# Patient Record
Sex: Female | Born: 1980 | Race: White | Hispanic: Yes | Marital: Single | Smoking: Never smoker
Health system: Southern US, Community
[De-identification: ages and names within clinical notes are randomized; demographics above are authoritative.]

## PROBLEM LIST (undated history)

## (undated) DIAGNOSIS — E119 Type 2 diabetes mellitus without complications: Secondary | ICD-10-CM

## (undated) DIAGNOSIS — E079 Disorder of thyroid, unspecified: Secondary | ICD-10-CM

## (undated) DIAGNOSIS — N912 Amenorrhea, unspecified: Secondary | ICD-10-CM

## (undated) HISTORY — DX: Type 2 diabetes mellitus without complications: E11.9

## (undated) HISTORY — DX: Amenorrhea, unspecified: N91.2

## (undated) HISTORY — DX: Disorder of thyroid, unspecified: E07.9

## (undated) HISTORY — PX: WISDOM TOOTH EXTRACTION: SHX21

---

## 2004-09-27 ENCOUNTER — Ambulatory Visit: Payer: Self-pay | Admitting: Obstetrics & Gynecology

## 2005-12-12 ENCOUNTER — Encounter: Admission: RE | Admit: 2005-12-12 | Discharge: 2006-03-12 | Payer: Self-pay | Admitting: Family Medicine

## 2005-12-21 ENCOUNTER — Ambulatory Visit: Payer: Self-pay | Admitting: Obstetrics and Gynecology

## 2008-02-27 ENCOUNTER — Emergency Department (HOSPITAL_COMMUNITY): Admission: EM | Admit: 2008-02-27 | Discharge: 2008-02-28 | Payer: Self-pay | Admitting: Emergency Medicine

## 2008-03-13 ENCOUNTER — Other Ambulatory Visit: Admission: RE | Admit: 2008-03-13 | Discharge: 2008-03-13 | Payer: Self-pay | Admitting: Family Medicine

## 2008-03-13 ENCOUNTER — Ambulatory Visit: Payer: Self-pay | Admitting: Family Medicine

## 2008-03-13 ENCOUNTER — Encounter: Payer: Self-pay | Admitting: Family

## 2008-04-03 ENCOUNTER — Ambulatory Visit: Payer: Self-pay | Admitting: Family Medicine

## 2009-08-04 ENCOUNTER — Ambulatory Visit (HOSPITAL_COMMUNITY): Admission: RE | Admit: 2009-08-04 | Discharge: 2009-08-04 | Payer: Self-pay | Admitting: Obstetrics & Gynecology

## 2009-08-25 ENCOUNTER — Ambulatory Visit (HOSPITAL_COMMUNITY)
Admission: RE | Admit: 2009-08-25 | Discharge: 2009-08-25 | Payer: Self-pay | Source: Home / Self Care | Admitting: Family Medicine

## 2009-12-10 ENCOUNTER — Ambulatory Visit (HOSPITAL_COMMUNITY)
Admission: RE | Admit: 2009-12-10 | Discharge: 2009-12-10 | Payer: Self-pay | Source: Home / Self Care | Admitting: Family Medicine

## 2010-01-09 ENCOUNTER — Inpatient Hospital Stay (HOSPITAL_COMMUNITY)
Admission: AD | Admit: 2010-01-09 | Discharge: 2010-01-11 | Payer: Self-pay | Source: Home / Self Care | Admitting: Obstetrics & Gynecology

## 2010-04-26 LAB — CBC
HCT: 36.9 % (ref 36.0–46.0)
MCH: 29.4 pg (ref 26.0–34.0)
MCV: 86 fL (ref 78.0–100.0)
RBC: 4.29 MIL/uL (ref 3.87–5.11)
WBC: 8.3 10*3/uL (ref 4.0–10.5)

## 2010-05-30 LAB — URINALYSIS, ROUTINE W REFLEX MICROSCOPIC
Bilirubin Urine: NEGATIVE
Glucose, UA: NEGATIVE mg/dL
Hgb urine dipstick: NEGATIVE
Specific Gravity, Urine: 1.026 (ref 1.005–1.030)

## 2010-05-30 LAB — WET PREP, GENITAL
Clue Cells Wet Prep HPF POC: NONE SEEN
Trich, Wet Prep: NONE SEEN
Yeast Wet Prep HPF POC: NONE SEEN

## 2010-05-30 LAB — POCT PREGNANCY, URINE: Preg Test, Ur: NEGATIVE

## 2010-05-30 LAB — GC/CHLAMYDIA PROBE AMP, GENITAL: GC Probe Amp, Genital: NEGATIVE

## 2010-06-28 NOTE — Group Therapy Note (Signed)
Kristin Wall, Kristin Wall               ACCOUNT NO.:  [id number]   MEDICAL RECORD NO.:  [id number]          PATIENT TYPE:  WOC   LOCATION:  [facility]                   FACILITY:  [facility]   PHYSICIAN:  Sid Falcon, CNM  DATE OF BIRTH:  September 09, 1980   DATE OF SERVICE:                                  CLINIC NOTE   CHIEF COMPLAINT:  The patient is here for evaluation of a right labial  mole.  In addition, the patient is here for contraceptive start due to  irregular menses.   MEDICAL HISTORY:  1. PCOS.  2. Fertility.   The patient denies any new medical diagnosis in the past year.  [facility]  said it was a negative exam and a Pap smear was within normal limits.  The patient is reporting that she is not desiring pregnancy at this time  and that she would rather get her cycles regulated and desires to begin  oral contraceptive.  In regards to labial mole, the patient said she has  had it for several years and it has not changed in appearance and/or  size.  The patient did not get the labs done for PCOS due to finances  and declines them at this time.   PHYSICAL EXAMINATION:  The patient is alert and oriented x3 and in no  signs of acute distress.  Vagina:  There was approximately a 0.01-cm  circular, black, mole-like lesion on the right labial regular borders.  The site was numbed with 2% lidocaine, anesthesia obtained.  A pump  biopsy was done without difficulty.   ASSESSMENT:  1. Irregular menses.  2. Vaginal lesions.   PLAN:  Pump biopsy to the lab.  Prescription for Lo-Ovral FE, the  patient is to take 1 pill and is to abstain from sex for 2 weeks.  Take  a pregnancy test, if negative, to begin pills that day with backup  method use for 2 weeks.  The patient will follow up in 3 weeks for  results of the biopsy.      Sid Falcon, CNM     WM/MEDQ  D:  03/13/2008  T:  03/13/2008  Job:  [id number]

## 2010-07-01 NOTE — Group Therapy Note (Signed)
[name], [name]               ACCOUNT NO.:  [id number]   MEDICAL RECORD NO.:  [id number]          PATIENT TYPE:  WOC   LOCATION[facility]                   FACILITY:  [facility]   PHYSICIAN:  Deirdre Christy Gentles, CNM       DATE OF BIRTH:  [birth year]   DATE OF SERVICE:                                    CLINIC NOTE   REASON FOR VISIT:  Evaluate nipple lesion and possible PCOS.   HISTORY:  This is a 30 year old nulliparous Hispanic female who was most  recently seen November 22, 2005, at [facility] due to a nipple lesion and  she is also still unable to become pregnant and would like to get the  evaluation for PCOS.  The nipple lesion first erupted about two months ago  and she was seen in our clinic, given three days of antibiotics, it is  slightly larger in size but, otherwise, has been unchanged over about two  months.  It is not painful and not draining.  She has no nipple discharge.   The possible PCOS was initially evaluated by Dr. Penne Lash in August 2006.  She was to have lab work including TSH fasting, two hour GTT fasting, 17-  hydroxy progesterone, and husband was to get a semen analysis, however, none  of this was done due to not having finances.  She continues to have  intercourse several times a week, has never done basal body temperature  testing, and her menstrual history is essentially the same as she told us a  year ago, namely that her cycles are not exactly regular, but she does not  go long intervals between bleeds.  Of note, she has had some inter-menstrual  spotting.  Most recently, she spotted on October 10 and had a normal menses  that began November 28, 2005.  The previous menstrual period was sometime in  September 2007, she is unsure because she is not keeping a menstrual  calendar.  She denies any recent hirsutism.  She denies breast tenderness or  dyspareunia.   PAST GYN HISTORY:  Negative for cysts, tumors, STIs.  A Pap smear was done  on November 22, 2005,  at [facility] and was negative.   PAST MEDICAL AND SURGICAL HISTORY:  Noncontributory.   PHYSICAL EXAMINATION:  Obese female, NAD, some mild hirsutism and acne are  obvious.  Breast exam:  There is about a 3 cm red, scaly area, slightly  excoriated medial to left nipple, mostly on areola, this is not tender with  no purulent drainage.  There is no lymphadenopathy.  No discrete breast  mass.   ASSESSMENT:  Breast lesion, not healing spontaneously, unclear etiology,  possible PCOS, primary amenorrhea, obesity.   PLAN:  I consulted with Dr. Okey Dupre who examined the lesion and recommends  referral to a dermatologist for a biopsy.  She has been given the  prescription for a semen analysis, TSH fasting, two hour GTT fasting, 17-  hydroxy progesterone, which she is interested in obtaining if she can get  financial assistance.  She will apply for financial assistance today.  She  is also urged  to keep a menstrual calendar.  She is given another  prescription for a basal body temperature chart.  Her results from [facility] visit of November 28, 2005, showed negative Pap smear,  negative GC, negative Chlamydia, glucose 84, urine pregnancy test was  negative.  No labs are done today.  At the time of dictation, we are still  waiting to see who her dermatology referral will be with.           ______________________________  Caren Griffins, CNM     DP/MEDQ  D:  12/21/2005  T:  12/21/2005  Job:  [id number]

## 2010-07-01 NOTE — Group Therapy Note (Signed)
Kristin Wall, Kristin Wall               ACCOUNT NO.:  [id number]   MEDICAL RECORD NO.:  [id number]          PATIENT TYPE:  WOC   [facility]ATION[facility]                   [facility]ACILITY[facility]  [facility]   PHYSICIAN:  Elsie Lincoln, MD      DATE OF BIRTH:  1980/03/07   DATE OF SERVICE:  09/27/2004                                    CLINIC NOTE   REASON FOR VISIT:  The patient is a 30 year old G0 female who was sent here  from [facility] for workup of irregular menses. However, in speaking  with the patient, the patient does have a period every month. However,  sometimes it can be 3-7 days later than what she expected, so anywhere from  28-33 days. I explained to the patient that this can be normal.   The patient also has sex four times a week for the past year and has not  been able to be pregnant. She has never been pregnant before and her  boyfriend has never fathered any children to her knowledge. The patient does  not have molimina, no breast tenderness, no bloating, no moodiness with her  periods. It is questionable that this could be an anovulatory bleed.  Visually, the patient looks like she has hirsutism and acne and is  overweight. She does have a family history significant for diabetes. The  patient could be PCO and not ovulating, but just having some spotting.   PAST OBSTETRICAL HISTORY:  G0.   PAST GYNECOLOGICAL HISTORY:  No history of ovarian cysts, fibroid tumors,  sexually-transmitted diseases, or abnormal Pap smears.   PAST MEDICAL HISTORY:  Denies.   PAST SURGICAL HISTORY:  Denies.   PHYSICAL EXAMINATION:  GENERAL:  Well-nourished, well-developed, no apparent  distress. Positive acanthosis nigricans on the neck, hirsutism, and acne.  VITAL SIGNS:  Temperature 98.4, pulse 87, blood pressure 147/87, weight  208.4, height 5 feet 2.5 inches.   The patient questionable has polycystic ovarian syndrome. Will do TSH, a  fasting 2-hour GTT, and fasting 17-hydroxyprogesterone. Will also  get a  semen analysis. The patient is to keep a menstrual calendar and come back in  4 weeks for results.           ______________________________  Elsie Lincoln, MD     KL/MEDQ  D:  09/27/2004  T:  09/28/2004  Job:  [id number]

## 2011-10-17 IMAGING — US US OB COMP +14 WK
2 series · 14 of 28 positions shown · non-contrast
Comparison: none

OBSTETRICAL ULTRASOUND:
 This ultrasound exam was performed in the [HOSPITAL] Ultrasound Department.  The OB US report was generated in the AS system, and faxed to the ordering physician.  This report is also available in [HOSPITAL]?s AccessANYware and in [REDACTED] PACS.

[Series 1: us ob detail +14 wk · 0.22mm/px · 11 of 50 slices shown (1 of 2)]
[im 3/50]
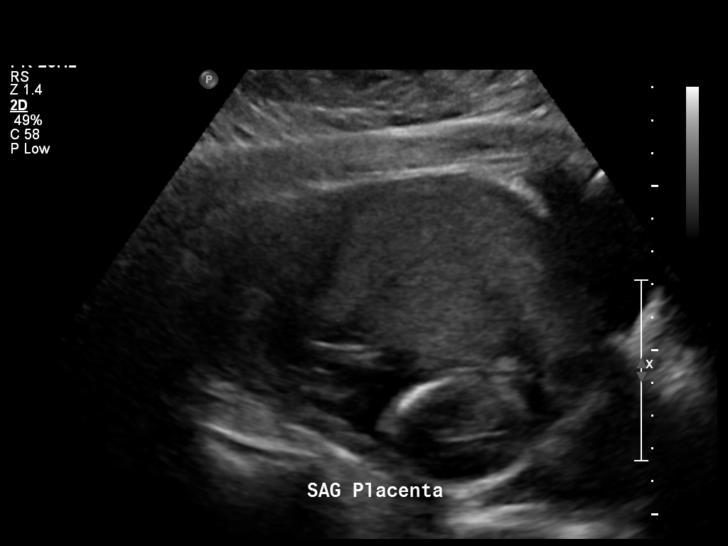
[im 7/50]
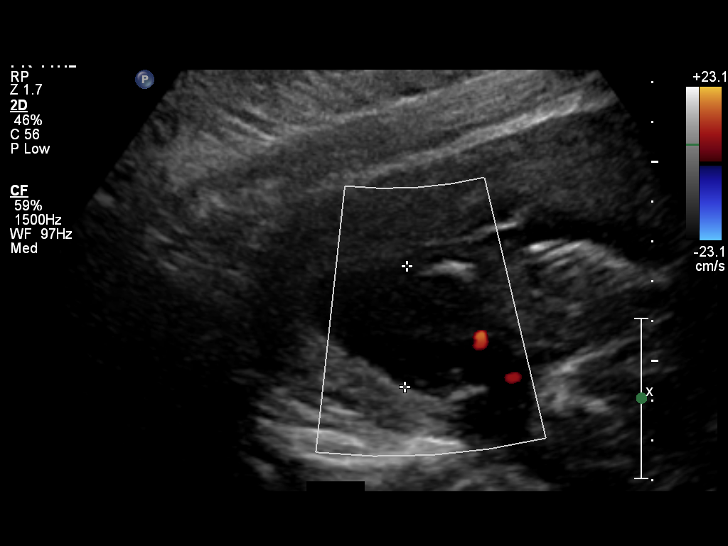
[im 12/50]
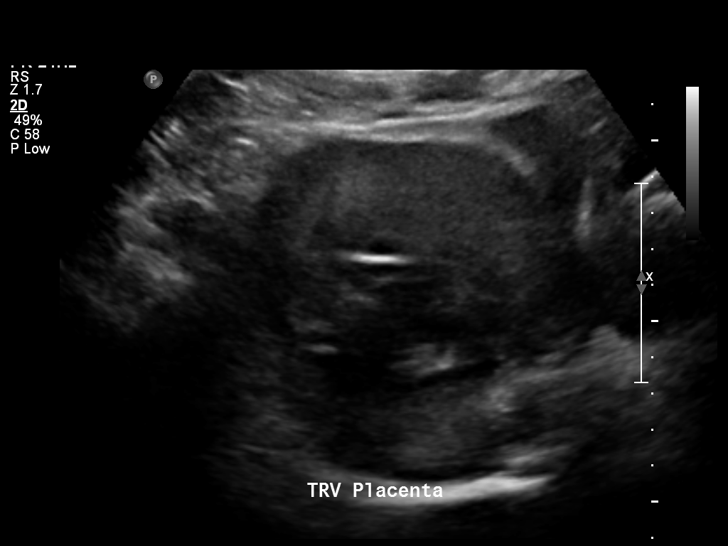
[im 16/50]
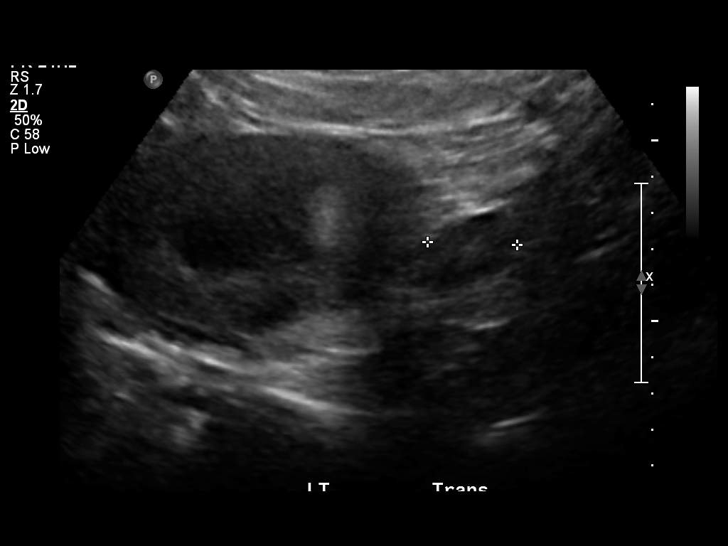
[im 21/50]
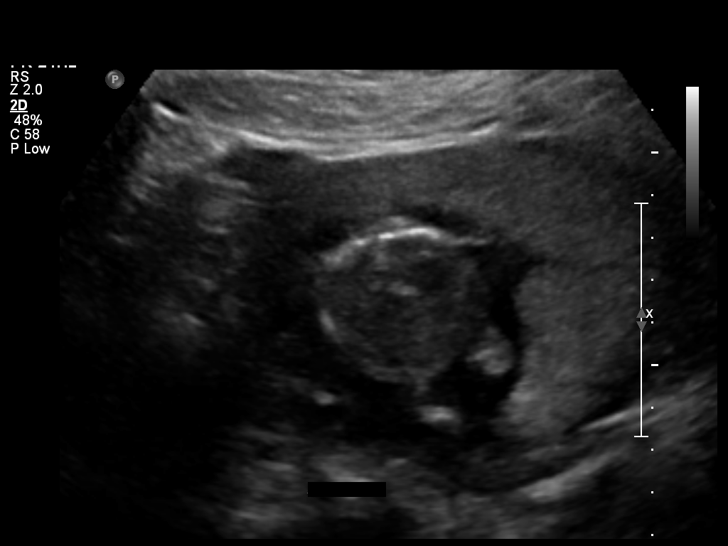
[im 25/50]
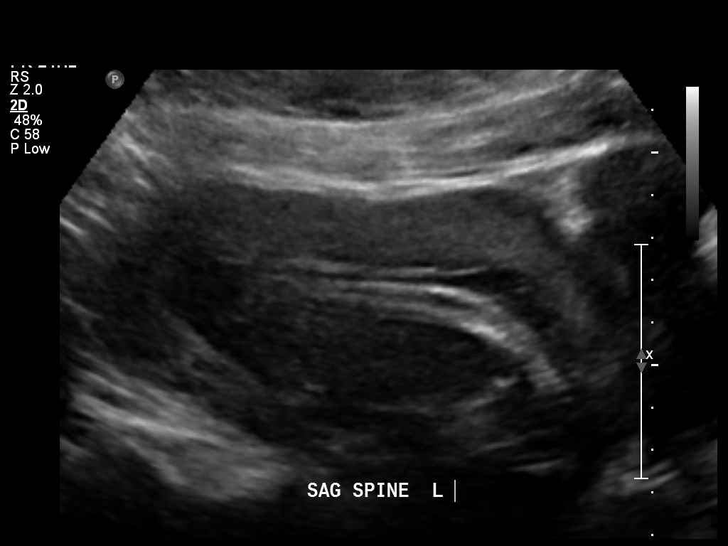
[im 29/50]
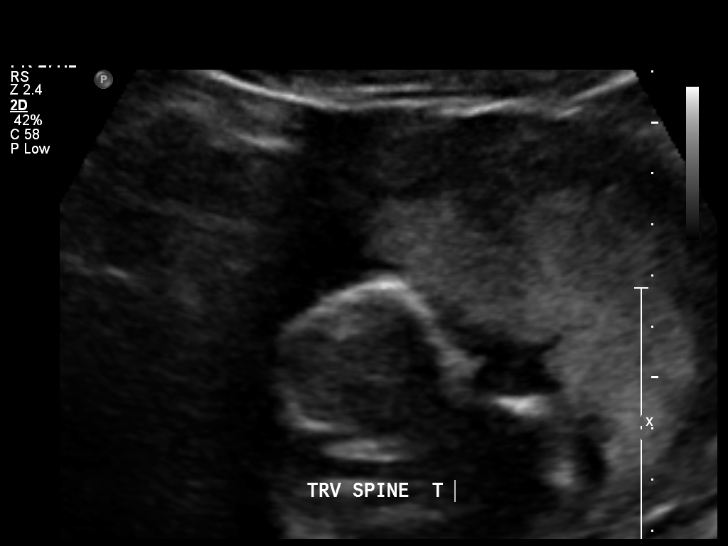
[im 34/50]
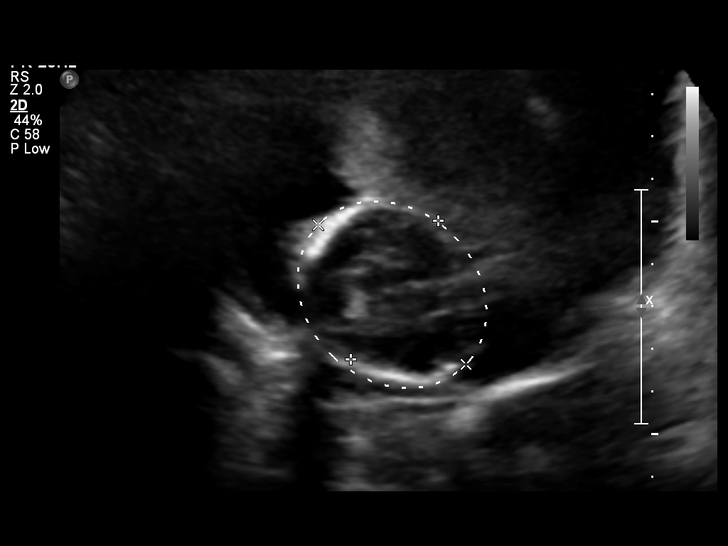
[im 38/50]
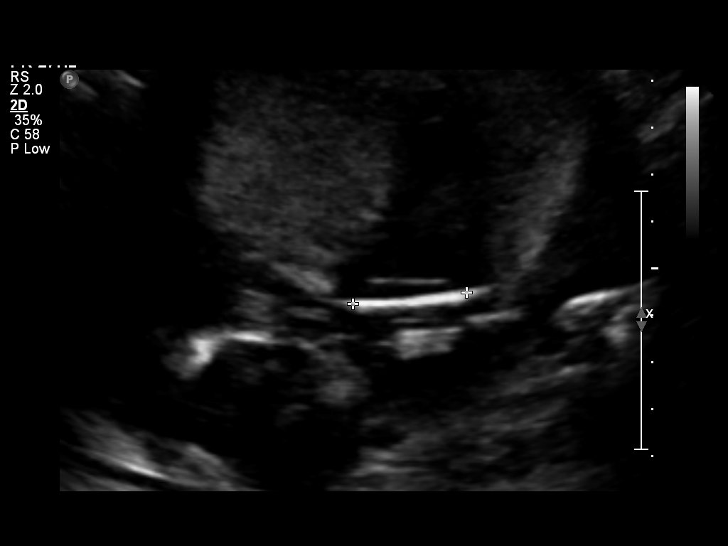
[im 43/50]
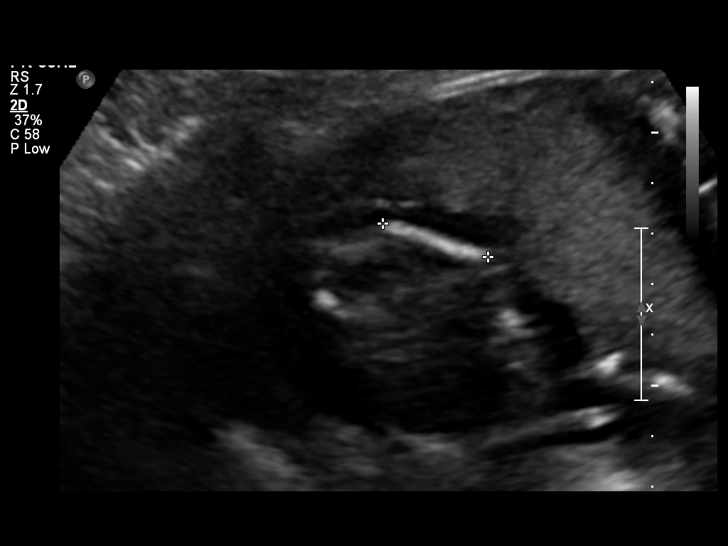
[im 47/50]
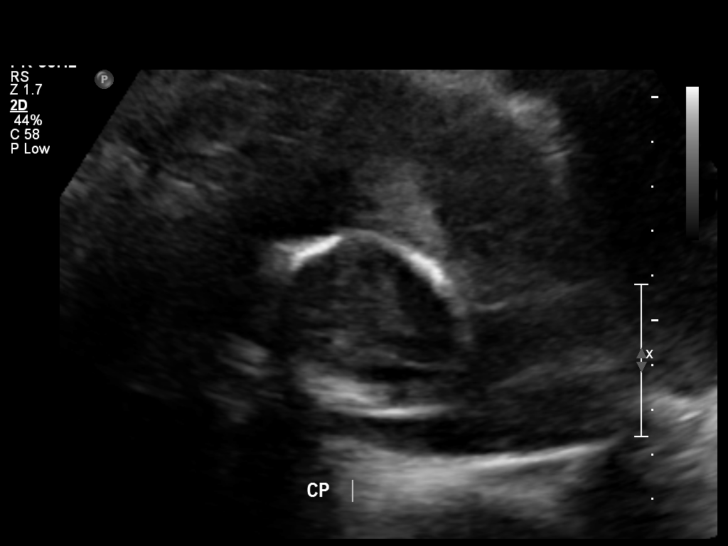

[Series 1: us ob detail +14 wk · 0.12mm/px · 3 of 12 slices shown (2 of 2)]
[im 1/12]
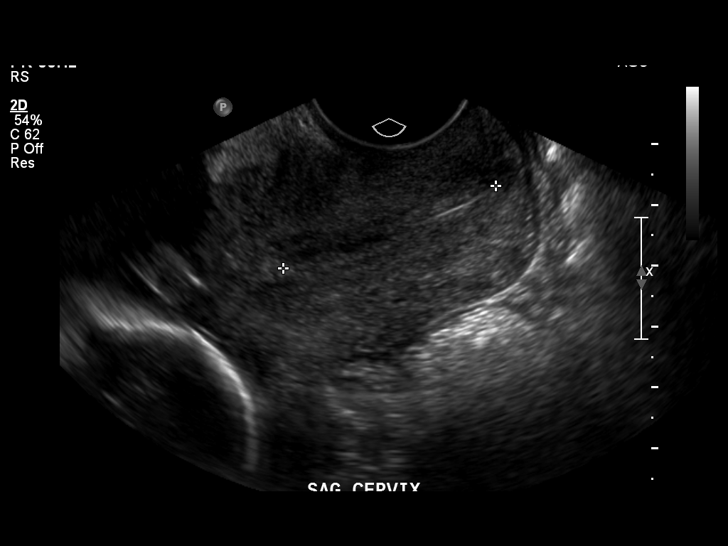
[im 6/12]
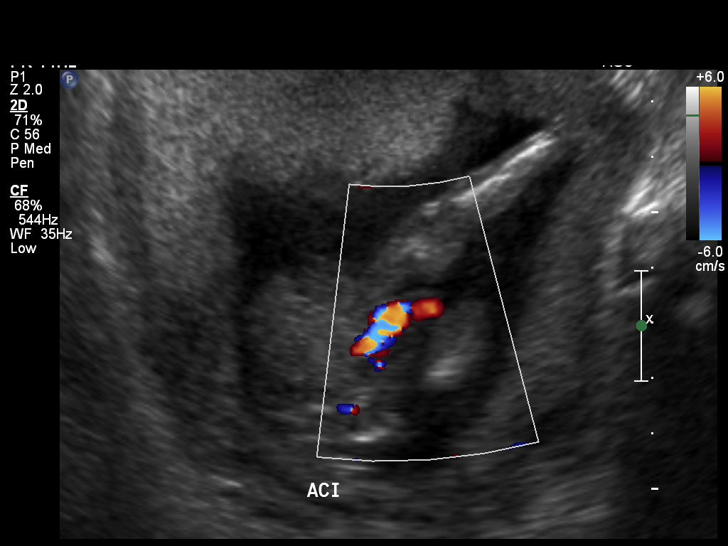
[im 12/12]
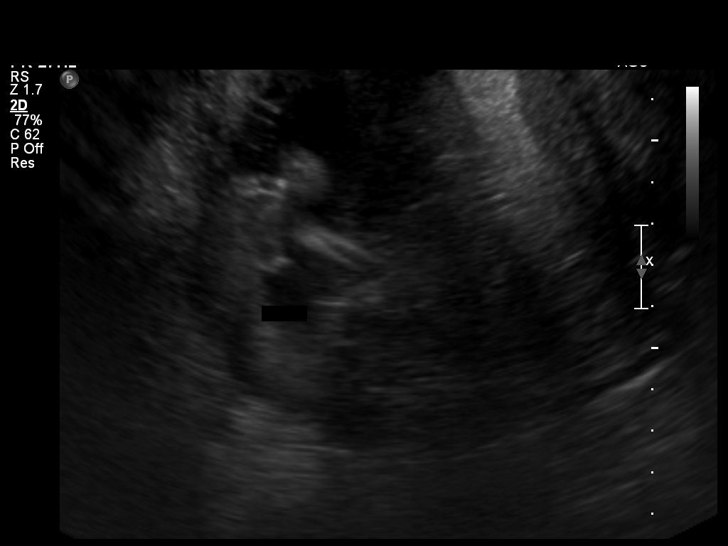

[14 of 28 positions shown; findings below may reference images not displayed]

IMPRESSION: See AS Obstetric US report.

## 2013-04-15 ENCOUNTER — Other Ambulatory Visit: Payer: Self-pay | Admitting: *Deleted

## 2013-04-15 DIAGNOSIS — E282 Polycystic ovarian syndrome: Secondary | ICD-10-CM

## 2013-04-23 ENCOUNTER — Encounter: Payer: Self-pay | Admitting: *Deleted

## 2013-05-19 ENCOUNTER — Other Ambulatory Visit: Payer: Self-pay | Admitting: Obstetrics & Gynecology

## 2013-05-19 ENCOUNTER — Other Ambulatory Visit: Payer: No Typology Code available for payment source

## 2013-05-19 DIAGNOSIS — E282 Polycystic ovarian syndrome: Secondary | ICD-10-CM

## 2013-05-20 LAB — FOLLICLE STIMULATING HORMONE: FSH: 3.6 m[IU]/mL

## 2013-05-20 LAB — HCG, QUANTITATIVE, PREGNANCY: hCG, Beta Chain, Quant, S: <2 m[IU]/mL

## 2013-05-20 LAB — TSH: TSH: 6.932 u[IU]/mL — AB (ref 0.350–4.500)

## 2013-05-20 LAB — LUTEINIZING HORMONE: LH: 3.9 m[IU]/mL

## 2013-05-21 ENCOUNTER — Encounter: Payer: Self-pay | Admitting: Family Medicine

## 2013-05-21 ENCOUNTER — Ambulatory Visit (INDEPENDENT_AMBULATORY_CARE_PROVIDER_SITE_OTHER): Payer: Self-pay | Admitting: Family Medicine

## 2013-05-21 VITALS — BP 128/89 | HR 74 | Temp 98.2°F | Resp 20 | Ht 62.5 in | Wt 234.1 lb

## 2013-05-21 DIAGNOSIS — N911 Secondary amenorrhea: Secondary | ICD-10-CM | POA: Insufficient documentation

## 2013-05-21 DIAGNOSIS — E039 Hypothyroidism, unspecified: Secondary | ICD-10-CM | POA: Insufficient documentation

## 2013-05-21 DIAGNOSIS — N912 Amenorrhea, unspecified: Secondary | ICD-10-CM

## 2013-05-21 LAB — T4, FREE: Free T4: 1.04 ng/dL (ref 0.80–1.80)

## 2013-05-21 LAB — T3, FREE: T3, Free: 3.6 pg/mL (ref 2.3–4.2)

## 2013-05-21 MED ORDER — MEDROXYPROGESTERONE ACETATE 10 MG PO TABS: 10.0000 mg | ORAL_TABLET | Freq: Every day | ORAL | Status: DC

## 2013-05-21 MED ORDER — LEVOTHYROXINE SODIUM 50 MCG PO TABS: 50.0000 ug | ORAL_TABLET | Freq: Every day | ORAL | Status: DC

## 2013-05-21 NOTE — Assessment & Plan Note (Signed)
Also check PRL.--give Provera.

## 2013-05-21 NOTE — Patient Instructions (Signed)
Amenorrea secundaria  (Secondary Amenorrhea ) La amenorrea secundaria es la detencin del flujo menstrual durante 3 6 meses en una mujer que previamente tena sus perodos. Hay muchas causas posibles: La mayora de las causas no son graves. Generalmente, al tratar el problema subyacente que causa la detencin de la [place], podr volver a tener sus perodos normales. CAUSAS  Algunas causas comunes de la falta de menstruacin son:  Desnutricin.  Bajo nivel de Banker (hipoglucemia).  Enfermedad poliqustica de los ovarios.  Estrs o miedos.  Aletta Edouard.  Desequilibrio hormonal.  Insuficiencia ovrica.  Medicamentos.  Obesidad extrema.  Fibrosis qustica.  Reduccin de peso drstica por cualquier causa.  Menopausia precoz.  Extirpacin de los ovarios o del tero.  Anticonceptivos.  Enfermedades.  Enfermedades de larga duracin (crnicas).  Sndrome de [place].  Problemas de tiroides.  Pldoras, parches o anillos vaginales para el control de la natalidad. FACTORES DE RIESGO Puede tener ms riesgo de amenorrea secundaria si:  Tiene una historia familiar de este problema.  Sufre un trastorno alimentario.  Realiza entrenamiento deportivo. DIAGNSTICO  Este diagnstico la realiza el mdico por medio de la historia clnica y el examen fsico. Incluir un examen plvico para verificar si hay problemas en los rganos reproductores. Debe descartarse la posibilidad de embarazo. Generalmente se indicarn diferentes anlisis de sangre para medir diferentes tipos de hormonas en el organismo. Le indicarn anlisis de [place]. Le harn algunos estudios especializados (ecografas, tomografa computada, resonancia magntica o histeroscopa) y tambin medirn su ndice de masa corporal [facility]). TRATAMIENTO  El tratamiento depende de la causa de la [place]. Si hay un trastorno de Psychologist, sport and exercise, deber tratarse con la dieta y la terapia [place]. Los  trastornos crnicos pueden mejorar con el tratamiento de la enfermedad. La amenorrea puede corregirse con medicamentos, cambios en el estilo de vida o con [place]. Si la amenorrea no puede corregirse, algunas veces es posible crear una falsa menstruacin con medicamentos. INSTRUCCIONES PARA EL CUIDADO EN EL HOGAR  Consuma una dieta saludable.  Controle los problemas de [place].  Haga ejercicios con regularidad, pero no excesivamente.  Duerma lo suficiente.  Controle el estrs.  Observe si hay cambios en el ciclo menstrual. Mantenga un registro del momento en que ocurren los perodos. Anote la fecha de inicio de los perodos, cunto duran y si hay problemas. SOLICITE ATENCIN MDICA SI: Los sntomas no mejoran con Scientist, research (medical). Document Released: 10/02/2012 [facility] Patient Information 2014 [place], [place]. Hipotiroidismo (Hypothyroidism) La tiroides es una glndula grande ubicada en la parte anterior e inferior del cuello. La glndula tiroides interviene en el control del metabolismo. El metabolismo es el modo en que el organismo utiliza los alimentos. El control del metabolismo se realiza a travs de una hormona denominada tiroxina. Cuando la actividad de esta glndula est por debajo de lo normal (hipotiroidismo) produce muy poca cantidad de hormona. CAUSAS Aqu se incluyen:   Ausencia de tejido tiroideo.  Bocio por dficit de yodo.  Bocio por medicamentos.  Defectos congnitos (desde el nacimiento).  Trastornos de la glndula pituitaria Esto ocasiona la falta de TSH (sigla que significa hormona estimulante de la tiroides) Esta hormona le informa a la tiroides que debe producir ms hormona. SNTOMAS  Letargia (sentir que no se tiene Engineer, drilling)  Intolerancia al fro  [employer] (a pesar de una ingesta normal de alimentos)  Piel seca  Cabello seco  Irregularidades menstruales  Enlentecimiento de los procesos de pensamiento La insuficiente cantidad de hormona tiroidea  tambin puede Data processing manager  problemas cardacos. El hipotiroidismo en el recin nacido es el cretinismo en su forma extrema. Es importante que esta forma se trate de modo adecuado e inmediato, ya que puede conducir rpidamente al retardo del desarrollo fsico y mental. DIAGNSTICO Para comprobar la existencia de hipotiroidismo, Hydrographic surveyorel profesional le solicitar anlisis de sangre y radiografas y estudios con Hillsideultrasonido. Muchas veces los signos estn ocultos. Es necesario que el profesional vigile la enfermedad con anlisis de Pinehurstsangre. Esto se realiza luego de Actorestablecer un diagnstico (determinar cul es el problema). Puede ser necesario que el profesional que lo asiste controle esta enfermedad con anlisis de sangre ya sea antes o despus del diagnstico y Spencerel tratamiento. TRATAMIENTO Los niveles bajos de hormona tiroidea se incrementan con el uso de hormona tiroidea sinttica. Este es un tratamiento seguro y Lelandefectivo. Se dispone de hormona tiroidea sinttica para el tratamiento de este trastorno. Generalmente lleva algunas semanas obtener el efecto total de los medicamentos. Luego de obtener el efecto completo del Norwalkmedicamento, habitualmente pasan otras cuatro semanas para que los sntomas empiezan a Geneticist, moleculardesaparecer. El profesional podr comenzar indicndole dosis bajas. Si usted tuvo problemas cardacos, la dosis se aumentar de manera gradual. Podr volver a lo normal sin Marketing executiveentrar en una situacin de emergencia. INSTRUCCIONES PARA EL CUIDADO DOMICILIARIO  Tome los Estée Laudermedicamentos como le ha indicado el profesional que lo asiste. Infrmele al profesional todos los medicamentos que toma o que ha comenzado a Careers advisertomar. El profesional que lo asiste lo ayudar con los esquemas de las dosis.  A medida que obtiene mejora, es necesario aumentar la dosis. Ser necesario Education officer, environmentalrealizar continuos anlisis de Syracusesangre, segn lo indique el profesional.  Informe acerca de todos los efectos secundarios que sospeche que podran deberse a los  medicamentos. SOLICITE ATENCIN MDICA SI: Solicite atencin mdica si observa:  Sudoracin.  Temblores.  Ansiedad.  Rpida prdida de peso.  Intolerancia al calor.  Cambios emocionales.  Diarrea.  Debilidad. SOLICITE ATENCIN MDICA DE INMEDIATO SI: Presenta dolor en el pecho, una frecuencia cardaca irregular (palpitaciones) o latidos cardacos rpidos. EST SEGURO QUE:   Comprende las instrucciones para el alta mdica.  Controlar su enfermedad.  Solicitar atencin mdica de inmediato segn las indicaciones. Document Released: 01/30/2005 Document Revised: 04/24/2011 [facility] Patient Information 2014 [place], [place].

## 2013-05-21 NOTE — Assessment & Plan Note (Signed)
Could be responsible for weight gain and secondary amenorrhea---add Free T3 and Free T4.  Begin 50 mcg synthroid.

## 2013-05-21 NOTE — Progress Notes (Signed)
    Subjective:    Patient ID: Kristin Wall is a 33 y.o. female presenting with Referral  on 05/21/2013  HPI:  [facility]. Menarche at age 33.  Cycles were heavy and last 5 days. Came monthly. Last year they stopped coming monthly. LMP is now 8/14--was on BCP and stopped.  Began OC's in 2011 after birth of last child.  Seeking pregnancy. Weight gain after stopping the pills.  20# weight gain. Labs checked with nml LH, FSH but elevated TSH. Negative BHCG.  Review of Systems  Constitutional: Positive for unexpected weight change. Negative for fever and chills.  Respiratory: Negative for shortness of breath.   Cardiovascular: Negative for chest pain.  Gastrointestinal: Negative for nausea, vomiting and abdominal pain.  Genitourinary: Negative for dysuria.  Skin: Negative for rash.      Objective:    BP 128/89  Pulse 74  Temp(Src) 98.2 F (36.8 C) (Oral)  Resp 20  Ht 5' 2.5" (1.588 m)  Wt 234 lb 1.6 oz (106.187 kg)  BMI 42.11 kg/m2  LMP 10/05/2012 Physical Exam  Constitutional: She is oriented to person, place, and time. She appears well-developed and well-nourished. No distress.  Moderately obese  HENT:  Head: Normocephalic and atraumatic.  Eyes: No scleral icterus.  Neck: Neck supple.  Cardiovascular: Normal rate.   Pulmonary/Chest: Effort normal.  Abdominal: Soft.  Neurological: She is alert and oriented to person, place, and time.  Skin: Skin is warm and dry.     hirsute  Psychiatric: She has a normal mood and affect.        Assessment & Plan:   Hypothyroidism Could be responsible for weight gain and secondary amenorrhea---add Free T3 and Free T4.  Begin 50 mcg synthroid.  Secondary amenorrhea Also check PRL.--give Provera.    Return in about 3 months (around 08/20/2013).

## 2013-05-22 LAB — PROLACTIN: Prolactin: 22.8 ng/mL

## 2013-07-02 ENCOUNTER — Encounter: Payer: Self-pay | Admitting: Obstetrics & Gynecology

## 2013-07-02 ENCOUNTER — Ambulatory Visit (INDEPENDENT_AMBULATORY_CARE_PROVIDER_SITE_OTHER): Payer: Self-pay | Admitting: Obstetrics & Gynecology

## 2013-07-02 VITALS — BP 137/94 | HR 85 | Ht 62.5 in | Wt 235.1 lb

## 2013-07-02 DIAGNOSIS — E039 Hypothyroidism, unspecified: Secondary | ICD-10-CM

## 2013-07-02 NOTE — Progress Notes (Signed)
Patient only took meds for 30 days because she didn't know she had to keep taking them.

## 2013-07-02 NOTE — Progress Notes (Signed)
Subjective:     Patient ID: Kristin Wall, female   DOB: 07-15-1980, 33 y.o.   MRN: [id number]  HPI Pt presents for f/u of secondary amenorrhea.  She stops taking her meds as she did not understand that she was to continue the meds daily.  She did restart her menses.  She has no further issues.  She was off OCP's because she wanted to conceive.    Review of Systems     Objective:   Physical Exam BP 137/94  Pulse 85  Ht 5' 2.5" (1.588 m)  Wt 235 lb 1.6 oz (106.641 kg)  BMI 42.29 kg/m2  LMP 06/08/2013 PT in NAD Exam deferred     Assessment:     hypothroidism  oligomenorrhea    Plan:     Synthroid 50mcg daily F/u 3 months need repeat TSH at that time Referred to FP to manage hypothyroidism

## 2013-07-02 NOTE — Patient Instructions (Signed)
Hipotiroidismo  (Hypothyroidism)  La tiroides es una glándula grande ubicada en la parte anterior e inferior del cuello. La glándula tiroides interviene en el control del metabolismo. El metabolismo es el modo en que el organismo utiliza los alimentos. El control del metabolismo se realiza a través de una hormona denominada tiroxina. Cuando la actividad de esta glándula está por debajo de lo normal (hipotiroidismo) produce muy poca cantidad de hormona.  CAUSAS  Aquí se incluyen:   · Ausencia de tejido tiroideo.  · Bocio por déficit de yodo.  · Bocio por medicamentos.  · Defectos congénitos (desde el nacimiento).  · Trastornos de la glándula pituitaria Esto ocasiona la falta de TSH (sigla que significa hormona estimulante de la tiroides) Esta hormona le informa a la tiroides que debe producir más hormona.  SÍNTOMAS  · Letargia (sentir que no se tiene energía)  · Intolerancia al frío  · Aumento de peso (a pesar de una ingesta normal de alimentos)  · Piel seca  · Cabello seco  · Irregularidades menstruales  · Enlentecimiento de los procesos de pensamiento  La insuficiente cantidad de hormona tiroidea también puede ocasionar problemas cardíacos. El hipotiroidismo en el recién nacido es el cretinismo en su forma extrema. Es importante que esta forma se trate de modo adecuado e inmediato, ya que puede conducir rápidamente al retardo del desarrollo físico y mental.  DIAGNÓSTICO  Para comprobar la existencia de hipotiroidismo, el profesional le solicitará análisis de sangre y radiografías y estudios con ultrasonido. Muchas veces los signos están ocultos. Es necesario que el profesional vigile la enfermedad con análisis de sangre. Esto se realiza luego de establecer un diagnóstico (determinar cuál es el problema). Puede ser necesario que el profesional que lo asiste controle esta enfermedad con análisis de sangre ya sea antes o después del diagnóstico y el tratamiento.  TRATAMIENTO  Los niveles bajos de hormona tiroidea se  incrementan con el uso de hormona tiroidea sintética. Este es un tratamiento seguro y efectivo. Se dispone de hormona tiroidea sintética para el tratamiento de este trastorno. Generalmente lleva algunas semanas obtener el efecto total de los medicamentos. Luego de obtener el efecto completo del medicamento, habitualmente pasan otras cuatro semanas para que los síntomas empiezan a desaparecer. El profesional podrá comenzar indicándole dosis bajas. Si usted tuvo problemas cardíacos, la dosis se aumentará de manera gradual. Podrá volver a lo normal sin entrar en una situación de emergencia.  INSTRUCCIONES PARA EL CUIDADO DOMICILIARIO  · Tome los medicamentos como le ha indicado el profesional que lo asiste. Infórmele al profesional todos los medicamentos que toma o que ha comenzado a tomar. El profesional que lo asiste lo ayudará con los esquemas de las dosis.  · A medida que obtiene mejoría, es necesario aumentar la dosis. Será necesario realizar continuos análisis de sangre, según lo indique el profesional.  · Informe acerca de todos los efectos secundarios que sospeche que podrían deberse a los medicamentos.  SOLICITE ATENCIÓN MÉDICA SI:  Solicite atención médica si observa:  · Sudoración.  · Temblores.  · Ansiedad.  · Rápida pérdida de peso.  · Intolerancia al calor.  · Cambios emocionales.  · Diarrea.  · Debilidad.  SOLICITE ATENCIÓN MÉDICA DE INMEDIATO SI:  Presenta dolor en el pecho, una frecuencia cardíaca irregular (palpitaciones) o latidos cardíacos rápidos.  ESTÉ SEGURO QUE:   · Comprende las instrucciones para el alta médica.  · Controlará su enfermedad.  · Solicitará atención médica de inmediato según las indicaciones.  Document Released: 01/30/2005 Document Revised:   04/24/2011  ExitCare® Patient Information ©2014 ExitCare, LLC.

## 2013-07-09 ENCOUNTER — Encounter: Payer: Self-pay | Admitting: *Deleted

## 2013-08-04 ENCOUNTER — Ambulatory Visit: Payer: Self-pay | Admitting: Family Medicine

## 2013-09-01 ENCOUNTER — Ambulatory Visit: Payer: No Typology Code available for payment source | Attending: Internal Medicine

## 2013-09-30 ENCOUNTER — Telehealth: Payer: Self-pay

## 2013-09-30 ENCOUNTER — Other Ambulatory Visit: Payer: Self-pay | Admitting: Family Medicine

## 2013-09-30 NOTE — Telephone Encounter (Signed)
Patient called requesting refill of synthroid.

## 2013-09-30 NOTE — Telephone Encounter (Signed)
Per chart review patient is to follow up with PCP for management of hypothyroid. Called patient with interpreter. Asked patient if she knew she missed referral appointment to [facility]-- pt. Stated she did. She stated she then called us about getting another appointment to family practice and [facility] patient was not given appointment to [facility] but was told to call back this Friday as more appointments may be available. Informed patient I will send message to provider about refilling medication until she can make primary care appointment and that we would call back once we have more information. Patient currently has two days worth of medication.

## 2013-10-02 ENCOUNTER — Other Ambulatory Visit: Payer: Self-pay

## 2013-10-02 NOTE — Telephone Encounter (Signed)
Called patient with interpreter. Informed patient medication was refilled. Patient verbalized understanding and gratitude. No further questions.

## 2013-10-02 NOTE — Telephone Encounter (Signed)
Dr. Shawnie PonsPratt re-ordered patient's medication 10/01/13. Attempted to call patient with pacific interpreter-- no answer-- no voicemail set up-- unable to leave message.

## 2013-10-02 NOTE — Progress Notes (Unsigned)
Opened in error

## 2013-10-03 ENCOUNTER — Other Ambulatory Visit: Payer: Self-pay | Admitting: Family Medicine

## 2013-10-06 ENCOUNTER — Telehealth: Payer: Self-pay

## 2013-10-06 NOTE — Telephone Encounter (Signed)
Per Dr. Shawnie Pons, have patient come in for TSH lab only (patient on synthroid). Called patient with [facility] interpreter. No answer. No voicemail set up. Unable to leave message. RN to attempt again later.

## 2013-10-07 NOTE — Telephone Encounter (Signed)
Called patient with [place] interpreter [phone]. Informed patient of the need to come for lab draw in order to ensure dosage of medication is correct. Patient states she has an appointment [facility] another [facility] 10/14/13 and wants to know if she needs to cancel. Explained to patient that she should not cancel as that appointment is with PCP to manage thyroid disease long term but explained that because RX has been prescribed here we want to ensure dosage is appropriate and managing levels thus far until her appointment. Patient verbalized understanding. States she can come to clinic tomorrow 10/08/13 at 1030 for lab draw. Informed patient I would have her put on schedule. Patient verbalized understanding. No further questions or concerns. Message sent to admin pool to add patient to schedule.

## 2013-10-08 ENCOUNTER — Other Ambulatory Visit: Payer: No Typology Code available for payment source

## 2013-10-08 DIAGNOSIS — R635 Abnormal weight gain: Secondary | ICD-10-CM

## 2013-10-09 LAB — TSH: TSH: 3.931 u[IU]/mL (ref 0.350–4.500)

## 2013-10-14 ENCOUNTER — Encounter: Payer: Self-pay | Admitting: Internal Medicine

## 2013-10-14 ENCOUNTER — Ambulatory Visit: Payer: No Typology Code available for payment source | Attending: Internal Medicine | Admitting: Internal Medicine

## 2013-10-14 VITALS — BP 123/86 | HR 87 | Temp 98.5°F | Resp 16 | Wt 236.0 lb

## 2013-10-14 DIAGNOSIS — N912 Amenorrhea, unspecified: Secondary | ICD-10-CM | POA: Insufficient documentation

## 2013-10-14 DIAGNOSIS — Z0000 Encounter for general adult medical examination without abnormal findings: Secondary | ICD-10-CM | POA: Insufficient documentation

## 2013-10-14 DIAGNOSIS — Z139 Encounter for screening, unspecified: Secondary | ICD-10-CM

## 2013-10-14 DIAGNOSIS — E039 Hypothyroidism, unspecified: Secondary | ICD-10-CM | POA: Insufficient documentation

## 2013-10-14 DIAGNOSIS — N926 Irregular menstruation, unspecified: Secondary | ICD-10-CM

## 2013-10-14 LAB — CBC WITH DIFFERENTIAL/PLATELET
BASOS ABS: 0.1 10*3/uL (ref 0.0–0.1)
Basophils Relative: 1 % (ref 0–1)
Eosinophils Absolute: 0.2 10*3/uL (ref 0.0–0.7)
Eosinophils Relative: 3 % (ref 0–5)
HEMATOCRIT: 42 % (ref 36.0–46.0)
HEMOGLOBIN: 14.6 g/dL (ref 12.0–15.0)
LYMPHS PCT: 39 % (ref 12–46)
Lymphs Abs: 2.5 10*3/uL (ref 0.7–4.0)
MCH: 28 pg (ref 26.0–34.0)
MCHC: 34.8 g/dL (ref 30.0–36.0)
MCV: 80.5 fL (ref 78.0–100.0)
MONO ABS: 0.5 10*3/uL (ref 0.1–1.0)
MONOS PCT: 7 % (ref 3–12)
NEUTROS ABS: 3.3 10*3/uL (ref 1.7–7.7)
NEUTROS PCT: 50 % (ref 43–77)
Platelets: 289 10*3/uL (ref 150–400)
RBC: 5.22 MIL/uL — ABNORMAL HIGH (ref 3.87–5.11)
RDW: 13.7 % (ref 11.5–15.5)
WBC: 6.5 10*3/uL (ref 4.0–10.5)

## 2013-10-14 LAB — POCT URINE PREGNANCY: PREG TEST UR: NEGATIVE

## 2013-10-14 NOTE — Progress Notes (Signed)
Patient here to establish care Has a history of thyroid disease Has not had a period since April of this year The clinic she went to told it could be because of her thyroid

## 2013-10-14 NOTE — Progress Notes (Signed)
Patient Demographics  Kristin Wall, is a 33 y.o. female  ZOX:[id number]  [id number]  DOB - 05-01-1980  CC:  Chief Complaint  Patient presents with  . Establish Care       HPI: Kristin Wall is a 33 y.o. female here today to establish medical care. Patient has been diagnosed with hypothyroidism and has been on levothyroxine since April, she follows up with her OB/GYN has history of secondary amenorrhea, today her urine test for pregnancy is negative, currently denies any acute symptoms. Patient has No headache, No chest pain, No abdominal pain - No Nausea, No new weakness tingling or numbness, No Cough - SOB.  No Known Allergies Past Medical History  Diagnosis Date  . Amenorrhea    Current Outpatient Prescriptions on File Prior to Visit  Medication Sig Dispense Refill  . levothyroxine (SYNTHROID, LEVOTHROID) 50 MCG tablet TAKE ONE TABLET BY MOUTH ONCE DAILY BEFORE BREAKFAST  30 tablet  0  . medroxyPROGESTERone (PROVERA) 10 MG tablet Take 1 tablet (10 mg total) by mouth daily.  10 tablet  3   No current facility-administered medications on file prior to visit.   Family History  Problem Relation Age of Onset  . Diabetes Father   . Cancer Paternal Grandmother    History   Social History  . Marital Status: Single    Spouse Name: N/A    Number of Children: N/A  . Years of Education: N/A   Occupational History  . Not on file.   Social History Main Topics  . Smoking status: Never Smoker   . Smokeless tobacco: Not on file  . Alcohol Use: Yes     Comment: occasionally  . Drug Use: No  . Sexual Activity: Yes    Birth Control/ Protection: None   Other Topics Concern  . Not on file   Social History Narrative  . No narrative on file    Review of Systems: Constitutional: Negative for fever, chills, diaphoresis, activity change, appetite change and fatigue. HENT: Negative for ear pain, nosebleeds, congestion, facial swelling, rhinorrhea, neck  pain, neck stiffness and ear discharge.  Eyes: Negative for pain, discharge, redness, itching and visual disturbance. Respiratory: Negative for cough, choking, chest tightness, shortness of breath, wheezing and stridor.  Cardiovascular: Negative for chest pain, palpitations and leg swelling. Gastrointestinal: Negative for abdominal distention. Genitourinary: Negative for dysuria, urgency, frequency, hematuria, flank pain, decreased urine volume, difficulty urinating and dyspareunia.  Musculoskeletal: Negative for back pain, joint swelling, arthralgia and gait problem. Neurological: Negative for dizziness, tremors, seizures, syncope, facial asymmetry, speech difficulty, weakness, light-headedness, numbness and headaches.  Hematological: Negative for adenopathy. Does not bruise/bleed easily. Psychiatric/Behavioral: Negative for hallucinations, behavioral problems, confusion, dysphoric mood, decreased concentration and agitation.    Objective:   Filed Vitals:   10/14/13 1618  BP: 123/86  Pulse: 87  Temp: 98.5 F (36.9 C)  Resp: 16    Physical Exam: Constitutional: Obese female sitting comfortably not in acute distress. HENT: Normocephalic, atraumatic, External right and left ear normal. Oropharynx is clear and moist.  Eyes: Conjunctivae and EOM are normal. PERRLA, no scleral icterus. Neck: Normal ROM. Neck supple. No JVD. No tracheal deviation. No thyromegaly. CVS: RRR, S1/S2 +, no murmurs, no gallops, no carotid bruit.  Pulmonary: Effort and breath sounds normal, no stridor, rhonchi, wheezes, rales.  Abdominal: Soft. BS +, no distension, tenderness, rebound or guarding.  Musculoskeletal: Normal range of motion. No edema and no tenderness.  Neuro: Alert. Normal reflexes, muscle tone coordination. No  cranial nerve deficit. Skin: Skin is warm and dry. No rash noted. Not diaphoretic. No erythema. No pallor. Psychiatric: Normal mood and affect. Behavior, judgment, thought content  normal.  Lab Results  Component Value Date   WBC 8.3 01/09/2010   HGB 12.6 01/09/2010   HCT 36.9 01/09/2010   MCV 86.0 01/09/2010   PLT 206 01/09/2010   No results found for this basename: CREATININE, BUN, NA, K, CL, CO2    No results found for this basename: HGBA1C   Lipid Panel  No results found for this basename: chol, trig, hdl, cholhdl, vldl, ldlcalc       Assessment and plan:   1. Hypothyroidism, unspecified hypothyroidism type Recent TSH level was within normal range, she'll continue with levothyroxine 50 mcg daily, will repeat her TSH level on the next visit.  2. Missed periods Results for orders placed in visit on 10/14/13  POCT URINE PREGNANCY      Result Value Ref Range   Preg Test, Ur Negative      - POCT urine pregnancy negative, she'll follow up with her OB/GYN.  3. Screening Ordered baseline blood work.  - Vit D  25 hydroxy (rtn osteoporosis monitoring) - CBC with Differential - COMPLETE METABOLIC PANEL WITH GFR - Hemoglobin A1c    Return in about 3 months (around 01/13/2014) for hypothyroid.     Doris Cheadle, MD

## 2013-10-15 LAB — COMPLETE METABOLIC PANEL WITH GFR
ALK PHOS: 111 U/L (ref 39–117)
ALT: 102 U/L — AB (ref 0–35)
AST: 83 U/L — ABNORMAL HIGH (ref 0–37)
Albumin: 4.2 g/dL (ref 3.5–5.2)
BILIRUBIN TOTAL: 0.4 mg/dL (ref 0.2–1.2)
BUN: 10 mg/dL (ref 6–23)
CHLORIDE: 100 meq/L (ref 96–112)
CO2: 28 meq/L (ref 19–32)
Calcium: 8.9 mg/dL (ref 8.4–10.5)
Creat: 0.63 mg/dL (ref 0.50–1.10)
GFR, Est African American: >89 mL/min
GFR, Est Non African American: >89 mL/min
GLUCOSE: 133 mg/dL — AB (ref 70–99)
Potassium: 3.9 mEq/L (ref 3.5–5.3)
Sodium: 137 mEq/L (ref 135–145)
TOTAL PROTEIN: 7.5 g/dL (ref 6.0–8.3)

## 2013-10-15 LAB — HEMOGLOBIN A1C
HEMOGLOBIN A1C: 7.5 % — AB (ref <5.7)
Mean Plasma Glucose: 169 mg/dL — ABNORMAL HIGH (ref <117)

## 2013-10-15 LAB — VITAMIN D 25 HYDROXY (VIT D DEFICIENCY, FRACTURES): Vit D, 25-Hydroxy: 19 ng/mL — ABNORMAL LOW (ref 30–89)

## 2013-11-10 ENCOUNTER — Other Ambulatory Visit: Payer: Self-pay | Admitting: Emergency Medicine

## 2013-11-10 ENCOUNTER — Telehealth: Payer: Self-pay | Admitting: Internal Medicine

## 2013-11-10 MED ORDER — LEVOTHYROXINE SODIUM 50 MCG PO TABS: 50.0000 ug | ORAL_TABLET | Freq: Every day | ORAL | Status: DC

## 2013-11-10 NOTE — Telephone Encounter (Signed)
Medication reordered and e-scribed to [facility] 

## 2013-11-10 NOTE — Telephone Encounter (Signed)
Pt. Called to request a refill on levothyroxine (SYNTHROID, LEVOTHROID) 50 MCG tablet. Please f/u with pt.

## 2013-11-12 ENCOUNTER — Telehealth: Payer: Self-pay | Admitting: Internal Medicine

## 2013-11-12 ENCOUNTER — Ambulatory Visit: Payer: No Typology Code available for payment source | Attending: Internal Medicine

## 2013-11-12 ENCOUNTER — Other Ambulatory Visit: Payer: Self-pay

## 2013-11-12 NOTE — Telephone Encounter (Signed)
Patient is requesting medication refill for Levothyroxine 50 mcg. Please f/u with Patient.

## 2013-12-03 ENCOUNTER — Ambulatory Visit: Payer: Self-pay | Attending: Internal Medicine

## 2013-12-15 ENCOUNTER — Encounter: Payer: Self-pay | Admitting: Internal Medicine

## 2014-01-02 ENCOUNTER — Ambulatory Visit: Payer: No Typology Code available for payment source | Attending: Internal Medicine | Admitting: Internal Medicine

## 2014-01-02 ENCOUNTER — Encounter: Payer: Self-pay | Admitting: Internal Medicine

## 2014-01-02 ENCOUNTER — Ambulatory Visit (HOSPITAL_BASED_OUTPATIENT_CLINIC_OR_DEPARTMENT_OTHER): Payer: No Typology Code available for payment source

## 2014-01-02 VITALS — BP 131/85 | HR 71 | Temp 98.0°F | Resp 16 | Wt 230.0 lb

## 2014-01-02 DIAGNOSIS — E039 Hypothyroidism, unspecified: Secondary | ICD-10-CM | POA: Insufficient documentation

## 2014-01-02 DIAGNOSIS — E0865 Diabetes mellitus due to underlying condition with hyperglycemia: Secondary | ICD-10-CM

## 2014-01-02 DIAGNOSIS — Z833 Family history of diabetes mellitus: Secondary | ICD-10-CM | POA: Insufficient documentation

## 2014-01-02 DIAGNOSIS — E669 Obesity, unspecified: Secondary | ICD-10-CM | POA: Insufficient documentation

## 2014-01-02 DIAGNOSIS — E559 Vitamin D deficiency, unspecified: Secondary | ICD-10-CM | POA: Insufficient documentation

## 2014-01-02 DIAGNOSIS — Z23 Encounter for immunization: Secondary | ICD-10-CM | POA: Insufficient documentation

## 2014-01-02 DIAGNOSIS — E1165 Type 2 diabetes mellitus with hyperglycemia: Secondary | ICD-10-CM | POA: Insufficient documentation

## 2014-01-02 MED ORDER — METFORMIN HCL 500 MG PO TABS: 500.0000 mg | ORAL_TABLET | Freq: Two times a day (BID) | ORAL | Status: DC

## 2014-01-02 MED ORDER — FREESTYLE SYSTEM KIT: 1.0000 | PACK | Status: DC | PRN

## 2014-01-02 MED ORDER — VITAMIN D (ERGOCALCIFEROL) 1.25 MG (50000 UNIT) PO CAPS: 50000.0000 [IU] | ORAL_CAPSULE | ORAL | Status: DC

## 2014-01-02 NOTE — Patient Instructions (Signed)
La diabetes mellitus y los alimentos (Diabetes Mellitus and Food) Es importante que controle su nivel de azcar en la sangre (glucosa). El nivel de glucosa en sangre depende en gran medida de lo que usted come. Comer alimentos saludables en las cantidades adecuadas a lo largo del da, aproximadamente a la misma hora todos los das, lo ayudar a controlar su nivel de glucosa en sangre. Tambin puede ayudarlo a retrasar o evitar el empeoramiento de la diabetes mellitus. Comer de manera saludable incluso puede ayudarlo a mejorar el nivel de presin arterial y a alcanzar o mantener un peso saludable.  CMO PUEDEN AFECTARME LOS ALIMENTOS? Carbohidratos Los carbohidratos afectan el nivel de glucosa en sangre ms que cualquier otro tipo de alimento. El nutricionista lo ayudar a determinar cuntos carbohidratos puede consumir en cada comida y ensearle a contarlos. El recuento de carbohidratos es importante para mantener la glucosa en sangre en un nivel saludable, en especial si utiliza insulina o toma determinados medicamentos para la diabetes mellitus. Alcohol El alcohol puede provocar disminuciones sbitas de la glucosa en sangre (hipoglucemia), en especial si utiliza insulina o toma determinados medicamentos para la diabetes mellitus. La hipoglucemia es una afeccin que puede poner en peligro la vida. Los sntomas de la hipoglucemia (somnolencia, mareos y desorientacin) son similares a los sntomas de haber consumido mucho alcohol.  Si el mdico lo autoriza a beber alcohol, hgalo con moderacin y siga estas pautas:  Las mujeres no deben beber ms de un trago por da, y los hombres no deben beber ms de dos tragos por da. Un trago es igual a:  12 onzas (355 ml) de cerveza  5 onzas de vino (150 ml) de vino  1,5onzas (45ml) de bebidas espirituosas  No beba con el estmago vaco.  Mantngase hidratado. Beba agua, gaseosas dietticas o t helado sin azcar.  Las gaseosas comunes, los jugos y  otros refrescos podran contener muchos carbohidratos y se deben contar. QU ALIMENTOS NO SE RECOMIENDAN? Cuando haga las elecciones de alimentos, es importante que recuerde que todos los alimentos son distintos. Algunos tienen menos nutrientes que otros por porcin, aunque podran tener la misma cantidad de caloras o carbohidratos. Es difcil darle al cuerpo lo que necesita cuando consume alimentos con menos nutrientes. Estos son algunos ejemplos de alimentos que debera evitar ya que contienen muchas caloras y carbohidratos, pero pocos nutrientes:  Grasas trans (la mayora de los alimentos procesados incluyen grasas trans en la etiqueta de Informacin nutricional).  Gaseosas comunes.  Jugos.  Caramelos.  Dulces, como tortas, pasteles, rosquillas y galletas.  Comidas fritas. QU ALIMENTOS PUEDO COMER? Consuma alimentos ricos en nutrientes, que nutrirn el cuerpo y lo mantendrn saludable. Los alimentos que debe comer tambin dependern de varios factores, como:  Las caloras que necesita.  Los medicamentos que toma.  Su peso.  El nivel de glucosa en sangre.  El nivel de presin arterial.  El nivel de colesterol. Tambin debe consumir una variedad de alimentos, como:  Protenas, como carne, aves, pescado, tofu, frutos secos y semillas (las protenas de animales magros son mejores).  Frutas.  Verduras.  Productos lcteos, como leche, queso y yogur (descremados son mejores).  Panes, granos, pastas, cereales, arroz y frijoles.  Grasas, como aceite de oliva, margarina sin grasas trans, aceite de canola, aguacate y aceitunas. TODOS LOS QUE PADECEN DIABETES MELLITUS TIENEN EL MISMO PLAN DE COMIDAS? Dado que todas las personas que padecen diabetes mellitus son distintas, no hay un solo plan de comidas que funcione para todos. Es muy   importante que se rena con un nutricionista que lo ayudar a crear un plan de comidas adecuado para usted. Document Released: 05/09/2007  Document Revised: 02/04/2013 Exit[employer] Patient Information 2015 [employer]. This information is not intended to replace advice given to you by your health care provider. Make sure you discuss any questions you have with your health care provider.  

## 2014-01-02 NOTE — Progress Notes (Signed)
MRN: [id number] Name: Kristin Wall  Sex: female Age: 33 y.o. DOB: [birth year]  Allergies: Review of patient's allergies indicates no known allergies.  Chief Complaint  Patient presents with  . thyroid check    HPI: Patient is 33 y.o. female who has history of hypothyroidism, comes today for followup, currently she is taking levothyroxine 50 mcg daily, she had a blood work done which was reviewed with the patient noticed hemoglobin A1c of 7.5%,patient does report family history of diabetes, also she has vitamin D level deficiency. Patient currently denies any acute symptoms. Patient will like to get a flu shot.  Past Medical History  Diagnosis Date  . Amenorrhea     Past Surgical History  Procedure Laterality Date  . Wisdom tooth extraction        Medication List       This list is accurate as of: 01/02/14  3:04 PM.  Always use your most recent med list.               glucose monitoring kit monitoring kit  1 each by Does not apply route as needed for other. Dispense any model that is covered- dispense testing supplies for Q AC/ HS accuchecks- 1 month supply with one refil.     levothyroxine 50 MCG tablet  Commonly known as:  SYNTHROID, LEVOTHROID  Take 1 tablet (50 mcg total) by mouth daily before breakfast.     medroxyPROGESTERone 10 MG tablet  Commonly known as:  PROVERA  Take 1 tablet (10 mg total) by mouth daily.     metFORMIN 500 MG tablet  Commonly known as:  GLUCOPHAGE  Take 1 tablet (500 mg total) by mouth 2 (two) times daily with a meal.     Vitamin D (Ergocalciferol) 50000 UNITS Caps capsule  Commonly known as:  DRISDOL  Take 1 capsule (50,000 Units total) by mouth every 7 (seven) days.        Meds ordered this encounter  Medications  . metFORMIN (GLUCOPHAGE) 500 MG tablet    Sig: Take 1 tablet (500 mg total) by mouth 2 (two) times daily with a meal.    Dispense:  180 tablet    Refill:  3  . Vitamin D, Ergocalciferol, (DRISDOL) 50000  UNITS CAPS capsule    Sig: Take 1 capsule (50,000 Units total) by mouth every 7 (seven) days.    Dispense:  12 capsule    Refill:  0  . glucose monitoring kit (FREESTYLE) monitoring kit    Sig: 1 each by Does not apply route as needed for other. Dispense any model that is covered- dispense testing supplies for Q AC/ HS accuchecks- 1 month supply with one refil.    Dispense:  1 each    Refill:  1     There is no immunization history on file for this patient.  Family History  Problem Relation Age of Onset  . Diabetes Father   . Cancer Paternal Grandmother     History  Substance Use Topics  . Smoking status: Never Smoker   . Smokeless tobacco: Not on file  . Alcohol Use: Yes     Comment: occasionally    Review of Systems   As noted in HPI  Filed Vitals:   01/02/14 1442  BP: 131/85  Pulse: 71  Temp: 98 F (36.7 C)  Resp: 16    Physical Exam  Physical Exam  Constitutional: No distress.  Obese female sitting comfortably not in acute distress   Eyes:  EOM are normal. Pupils are equal, round, and reactive to light.  Cardiovascular: Normal rate and regular rhythm.   Pulmonary/Chest: Breath sounds normal. No respiratory distress. She has no wheezes. She has no rales.  Musculoskeletal: She exhibits no edema.    CBC    Component Value Date/Time   WBC 6.5 10/14/2013 1702   RBC 5.22* 10/14/2013 1702   HGB 14.6 10/14/2013 1702   HCT 42.0 10/14/2013 1702   PLT 289 10/14/2013 1702   MCV 80.5 10/14/2013 1702   LYMPHSABS 2.5 10/14/2013 1702   MONOABS 0.5 10/14/2013 1702   EOSABS 0.2 10/14/2013 1702   BASOSABS 0.1 10/14/2013 1702    CMP     Component Value Date/Time   NA 137 10/14/2013 1702   K 3.9 10/14/2013 1702   CL 100 10/14/2013 1702   CO2 28 10/14/2013 1702   GLUCOSE 133* 10/14/2013 1702   BUN 10 10/14/2013 1702   CREATININE 0.63 10/14/2013 1702   CALCIUM 8.9 10/14/2013 1702   PROT 7.5 10/14/2013 1702   ALBUMIN 4.2 10/14/2013 1702   AST 83* 10/14/2013  1702   ALT 102* 10/14/2013 1702   ALKPHOS 111 10/14/2013 1702   BILITOT 0.4 10/14/2013 1702   GFRNONAA >89 10/14/2013 1702   GFRAA >89 10/14/2013 1702    No results found for: CHOL  No components found for: HGA1C  Lab Results  Component Value Date/Time   AST 83* 10/14/2013 05:02 PM    Assessment and Plan  Hypothyroidism, unspecified hypothyroidism type - Plan: currently patient is on levothyroxine 50 mcg daily, repeat her TSH level  Vitamin D deficiency - Plan:started patient on Vitamin D, Ergocalciferol, (DRISDOL) 50000 UNITS CAPS capsule  Diabetes mellitus due to underlying condition with hyperglycemia - Plan: daily diagnosed diabetes, advise patient for diabetes meal planning, started her on metFORMIN (GLUCOPHAGE) 500 MG tablet, will check C-peptide, Anti-islet cell antibody, Glutamic acid decarboxylase auto abs, glucose monitoring kit (FREESTYLE) monitoring kit  Needs flu shot Flu shot given today  Family history of diabetes mellitus (DM)   Health Maintenance  -Vaccinations:  Flu shot today   Return in about 3 months (around 04/04/2014) for hypothyroid, diabetes.  Lorayne Marek, MD

## 2014-01-02 NOTE — Progress Notes (Signed)
Patient here with interpreter Here to have her thyroid checked

## 2014-01-03 LAB — TSH: TSH: 1.831 u[IU]/mL (ref 0.350–4.500)

## 2014-01-03 LAB — C-PEPTIDE: C PEPTIDE: 8.05 ng/mL — AB (ref 0.80–3.90)

## 2014-01-05 LAB — GLUTAMIC ACID DECARBOXYLASE AUTO ABS: GLUTAMIC ACID DECARB AB: 5.6 U/mL — AB (ref <=1.0)

## 2014-01-11 LAB — ANTI-ISLET CELL ANTIBODY: Pancreatic Islet Cell Antibody: <5 JDF Units (ref <5)

## 2014-02-11 ENCOUNTER — Telehealth: Payer: Self-pay | Admitting: Internal Medicine

## 2014-02-11 NOTE — Telephone Encounter (Signed)
Pt is calling to see if the dr can authorize a refill for levothyroxine (SYNTHROID, LEVOTHROID) 50 MCG tablet. Please follow up with pt.

## 2014-02-17 MED ORDER — LEVOTHYROXINE SODIUM 50 MCG PO TABS: 50.0000 ug | ORAL_TABLET | Freq: Every day | ORAL | Status: DC

## 2014-02-17 NOTE — Telephone Encounter (Signed)
Pt aware Rx Levothyroxine was send to [facility] (Information was given in spanish)

## 2014-02-17 NOTE — Telephone Encounter (Signed)
Pt requesting RX levothyroxine 50 MCG Is it ok to refill?

## 2014-02-17 NOTE — Telephone Encounter (Signed)
Yes its ok to do the refill

## 2014-05-22 ENCOUNTER — Ambulatory Visit: Payer: No Typology Code available for payment source | Attending: Internal Medicine | Admitting: Internal Medicine

## 2014-05-22 ENCOUNTER — Encounter: Payer: Self-pay | Admitting: Internal Medicine

## 2014-05-22 VITALS — BP 130/90 | HR 76 | Temp 98.8°F | Resp 16 | Wt 231.6 lb

## 2014-05-22 DIAGNOSIS — E119 Type 2 diabetes mellitus without complications: Secondary | ICD-10-CM | POA: Insufficient documentation

## 2014-05-22 DIAGNOSIS — E139 Other specified diabetes mellitus without complications: Secondary | ICD-10-CM

## 2014-05-22 DIAGNOSIS — E039 Hypothyroidism, unspecified: Secondary | ICD-10-CM | POA: Insufficient documentation

## 2014-05-22 LAB — COMPLETE METABOLIC PANEL WITH GFR
ALK PHOS: 106 U/L (ref 39–117)
ALT: 51 U/L — AB (ref 0–35)
AST: 41 U/L — AB (ref 0–37)
Albumin: 4.2 g/dL (ref 3.5–5.2)
BILIRUBIN TOTAL: 0.5 mg/dL (ref 0.2–1.2)
BUN: 12 mg/dL (ref 6–23)
CO2: 25 mEq/L (ref 19–32)
CREATININE: 0.56 mg/dL (ref 0.50–1.10)
Calcium: 9 mg/dL (ref 8.4–10.5)
Chloride: 101 mEq/L (ref 96–112)
GFR, Est African American: >89 mL/min
GFR, Est Non African American: >89 mL/min
Glucose, Bld: 135 mg/dL — ABNORMAL HIGH (ref 70–99)
Potassium: 4 mEq/L (ref 3.5–5.3)
Sodium: 137 mEq/L (ref 135–145)
Total Protein: 7.6 g/dL (ref 6.0–8.3)

## 2014-05-22 LAB — TSH: TSH: 4.135 u[IU]/mL (ref 0.350–4.500)

## 2014-05-22 LAB — POCT GLYCOSYLATED HEMOGLOBIN (HGB A1C): HEMOGLOBIN A1C: 6.8

## 2014-05-22 LAB — GLUCOSE, POCT (MANUAL RESULT ENTRY): POC Glucose: 149 mg/dl — AB (ref 70–99)

## 2014-05-22 MED ORDER — METFORMIN HCL 500 MG PO TABS: 500.0000 mg | ORAL_TABLET | Freq: Two times a day (BID) | ORAL | Status: DC

## 2014-05-22 MED ORDER — LEVOTHYROXINE SODIUM 50 MCG PO TABS: 50.0000 ug | ORAL_TABLET | Freq: Every day | ORAL | Status: DC

## 2014-05-22 NOTE — Patient Instructions (Signed)
La diabetes mellitus y los alimentos (Diabetes Mellitus and Food) Es importante que controle su nivel de azcar en la sangre (glucosa). El nivel de glucosa en sangre depende en gran medida de lo que usted come. Comer alimentos saludables en las cantidades adecuadas a lo largo del da, aproximadamente a la misma hora todos los das, lo ayudar a controlar su nivel de glucosa en sangre. Tambin puede ayudarlo a retrasar o evitar el empeoramiento de la diabetes mellitus. Comer de manera saludable incluso puede ayudarlo a mejorar el nivel de presin arterial y a alcanzar o mantener un peso saludable.  CMO PUEDEN AFECTARME LOS ALIMENTOS? Carbohidratos Los carbohidratos afectan el nivel de glucosa en sangre ms que cualquier otro tipo de alimento. El nutricionista lo ayudar a determinar cuntos carbohidratos puede consumir en cada comida y ensearle a contarlos. El recuento de carbohidratos es importante para mantener la glucosa en sangre en un nivel saludable, en especial si utiliza insulina o toma determinados medicamentos para la diabetes mellitus. Alcohol El alcohol puede provocar disminuciones sbitas de la glucosa en sangre (hipoglucemia), en especial si utiliza insulina o toma determinados medicamentos para la diabetes mellitus. La hipoglucemia es una afeccin que puede poner en peligro la vida. Los sntomas de la hipoglucemia (somnolencia, mareos y desorientacin) son similares a los sntomas de haber consumido mucho alcohol.  Si el mdico lo autoriza a beber alcohol, hgalo con moderacin y siga estas pautas:  Las mujeres no deben beber ms de un trago por da, y los hombres no deben beber ms de dos tragos por da. Un trago es igual a:  12 onzas (355 ml) de cerveza  5 onzas de vino (150 ml) de vino  1,5onzas (45ml) de bebidas espirituosas  No beba con el estmago vaco.  Mantngase hidratado. Beba agua, gaseosas dietticas o t helado sin azcar.  Las gaseosas comunes, los jugos y  otros refrescos podran contener muchos carbohidratos y se deben contar. QU ALIMENTOS NO SE RECOMIENDAN? Cuando haga las elecciones de alimentos, es importante que recuerde que todos los alimentos son distintos. Algunos tienen menos nutrientes que otros por porcin, aunque podran tener la misma cantidad de caloras o carbohidratos. Es difcil darle al cuerpo lo que necesita cuando consume alimentos con menos nutrientes. Estos son algunos ejemplos de alimentos que debera evitar ya que contienen muchas caloras y carbohidratos, pero pocos nutrientes:  Grasas trans (la mayora de los alimentos procesados incluyen grasas trans en la etiqueta de Informacin nutricional).  Gaseosas comunes.  Jugos.  Caramelos.  Dulces, como tortas, pasteles, rosquillas y galletas.  Comidas fritas. QU ALIMENTOS PUEDO COMER? Consuma alimentos ricos en nutrientes, que nutrirn el cuerpo y lo mantendrn saludable. Los alimentos que debe comer tambin dependern de varios factores, como:  Las caloras que necesita.  Los medicamentos que toma.  Su peso.  El nivel de glucosa en sangre.  El nivel de presin arterial.  El nivel de colesterol. Tambin debe consumir una variedad de alimentos, como:  Protenas, como carne, aves, pescado, tofu, frutos secos y semillas (las protenas de animales magros son mejores).  Frutas.  Verduras.  Productos lcteos, como leche, queso y yogur (descremados son mejores).  Panes, granos, pastas, cereales, arroz y frijoles.  Grasas, como aceite de oliva, margarina sin grasas trans, aceite de canola, aguacate y aceitunas. TODOS LOS QUE PADECEN DIABETES MELLITUS TIENEN EL MISMO PLAN DE COMIDAS? Dado que todas las personas que padecen diabetes mellitus son distintas, no hay un solo plan de comidas que funcione para todos. Es muy   importante que se rena con un nutricionista que lo ayudar a crear un plan de comidas adecuado para usted. Document Released: 05/09/2007  Document Revised: 02/04/2013 Exit[employer] Patient Information 2015 [employer]. This information is not intended to replace advice given to you by your health care provider. Make sure you discuss any questions you have with your health care provider.  

## 2014-05-22 NOTE — Progress Notes (Signed)
Patient here with in house interpreter Kristin Wall Patient here for follow up on her thyroid disease and diabetes

## 2014-05-22 NOTE — Progress Notes (Signed)
MRN: [id number] Name: Kristin Wall  Sex: female Age: 34 y.o. DOB: 02/27/1980  Allergies: Review of patient's allergies indicates no known allergies.  Chief Complaint  Patient presents with  . Follow-up    HPI: Patient is 34 y.o. female who history of hypothyroidism, on the last visit she was diagnosed with diabetes and was started on metformin as per patient for the last 2 weeks she has not taken metformin, though her hemoglobin A1c has improved from 7.5% to 6.8%, I have advised patient to be compliant with her medication, currently denies any acute symptoms denies any headache dizziness chest and shortness of breath, today she is requesting refill on her medications.  Past Medical History  Diagnosis Date  . Amenorrhea     Past Surgical History  Procedure Laterality Date  . Wisdom tooth extraction        Medication List       This list is accurate as of: 05/22/14  9:53 AM.  Always use your most recent med list.               glucose monitoring kit monitoring kit  1 each by Does not apply route as needed for other. Dispense any model that is covered- dispense testing supplies for Q AC/ HS accuchecks- 1 month supply with one refil.     levothyroxine 50 MCG tablet  Commonly known as:  SYNTHROID, LEVOTHROID  Take 1 tablet (50 mcg total) by mouth daily before breakfast.     medroxyPROGESTERone 10 MG tablet  Commonly known as:  PROVERA  Take 1 tablet (10 mg total) by mouth daily.     metFORMIN 500 MG tablet  Commonly known as:  GLUCOPHAGE  Take 1 tablet (500 mg total) by mouth 2 (two) times daily with a meal.     Vitamin D (Ergocalciferol) 50000 UNITS Caps capsule  Commonly known as:  DRISDOL  Take 1 capsule (50,000 Units total) by mouth every 7 (seven) days.        Meds ordered this encounter  Medications  . levothyroxine (SYNTHROID, LEVOTHROID) 50 MCG tablet    Sig: Take 1 tablet (50 mcg total) by mouth daily before breakfast.    Dispense:  30 tablet     Refill:  2  . metFORMIN (GLUCOPHAGE) 500 MG tablet    Sig: Take 1 tablet (500 mg total) by mouth 2 (two) times daily with a meal.    Dispense:  180 tablet    Refill:  3    Immunization History  Administered Date(s) Administered  . Influenza,inj,Quad PF,36+ Mos 01/02/2014    Family History  Problem Relation Age of Onset  . Diabetes Father   . Cancer Paternal Grandmother     History  Substance Use Topics  . Smoking status: Never Smoker   . Smokeless tobacco: Not on file  . Alcohol Use: Yes     Comment: occasionally    Review of Systems   As noted in HPI  Filed Vitals:   05/22/14 0915  BP: 130/90  Pulse: 76  Temp: 98.8 F (37.1 C)  Resp: 16    Physical Exam  Physical Exam  Constitutional:  Obese female sitting comfortably not in acute distress  HENT:  Head: Normocephalic and atraumatic.  Cardiovascular: Normal rate and regular rhythm.   Pulmonary/Chest: Breath sounds normal. No respiratory distress. She has no wheezes. She has no rales.  Musculoskeletal: She exhibits no edema.    CBC    Component Value Date/Time   WBC  6.5 10/14/2013 1702   RBC 5.22* 10/14/2013 1702   HGB 14.6 10/14/2013 1702   HCT 42.0 10/14/2013 1702   PLT 289 10/14/2013 1702   MCV 80.5 10/14/2013 1702   LYMPHSABS 2.5 10/14/2013 1702   MONOABS 0.5 10/14/2013 1702   EOSABS 0.2 10/14/2013 1702   BASOSABS 0.1 10/14/2013 1702    CMP     Component Value Date/Time   NA 137 10/14/2013 1702   K 3.9 10/14/2013 1702   CL 100 10/14/2013 1702   CO2 28 10/14/2013 1702   GLUCOSE 133* 10/14/2013 1702   BUN 10 10/14/2013 1702   CREATININE 0.63 10/14/2013 1702   CALCIUM 8.9 10/14/2013 1702   PROT 7.5 10/14/2013 1702   ALBUMIN 4.2 10/14/2013 1702   AST 83* 10/14/2013 1702   ALT 102* 10/14/2013 1702   ALKPHOS 111 10/14/2013 1702   BILITOT 0.4 10/14/2013 1702   GFRNONAA >89 10/14/2013 1702   GFRAA >89 10/14/2013 1702    No results found for: CHOL  Lab Results  Component Value  Date/Time   HGBA1C 6.80 05/22/2014 09:17 AM   HGBA1C 7.5* 10/14/2013 05:02 PM    Lab Results  Component Value Date/Time   AST 83* 10/14/2013 05:02 PM    Assessment and Plan  Other specified diabetes mellitus without complications - Plan: Results for orders placed or performed in visit on 05/22/14  Glucose (CBG)  Result Value Ref Range   POC Glucose 149.0 (A) 70 - 99 mg/dl  HgB A1c  Result Value Ref Range   Hemoglobin A1C 6.80    HgB A1c is improved, she is given refill on her medication, advise patient for diabetes meal planning, repeat A1c in 3 months , metFORMIN (GLUCOPHAGE) 500 MG tablet, COMPLETE METABOLIC PANEL WITH GFR  Hypothyroidism, unspecified hypothyroidism type - Plan:Continue with  levothyroxine (SYNTHROID, LEVOTHROID) 50 MCG tablet, will recheck TSH   Return in about 3 months (around 08/21/2014).   This note has been created with Surveyor, quantity. Any transcriptional errors are unintentional.    Lorayne Marek, MD

## 2014-06-02 ENCOUNTER — Telehealth: Payer: Self-pay | Admitting: *Deleted

## 2014-06-02 NOTE — Telephone Encounter (Signed)
Pt is aware of her lab results.  

## 2014-06-02 NOTE — Telephone Encounter (Signed)
-----   Message from Lestine Mountenise L Juarez, LPN sent at 1/61/09604/12/2014  1:56 PM EDT -----   ----- Message -----    From: Doris Cheadleeepak Advani, MD    Sent: 05/25/2014   9:31 AM      To: Lestine Mountenise L Juarez, LPN  Call and let the patient know that her TSH level is in normal range, continue with current dose of levothyroxine Also noted her liver enzymes are improved, advise patient to avoid alcohol and Tylenol, will repeat LFTs on the following visit.

## 2014-06-18 ENCOUNTER — Ambulatory Visit: Payer: No Typology Code available for payment source | Attending: Internal Medicine

## 2014-08-20 ENCOUNTER — Encounter: Payer: Self-pay | Admitting: Internal Medicine

## 2014-08-20 ENCOUNTER — Ambulatory Visit: Payer: No Typology Code available for payment source | Attending: Internal Medicine | Admitting: Internal Medicine

## 2014-08-20 VITALS — BP 129/86 | HR 76 | Temp 98.8°F | Resp 16 | Wt 232.0 lb

## 2014-08-20 DIAGNOSIS — E119 Type 2 diabetes mellitus without complications: Secondary | ICD-10-CM | POA: Insufficient documentation

## 2014-08-20 DIAGNOSIS — R7989 Other specified abnormal findings of blood chemistry: Secondary | ICD-10-CM | POA: Insufficient documentation

## 2014-08-20 DIAGNOSIS — R945 Abnormal results of liver function studies: Secondary | ICD-10-CM

## 2014-08-20 DIAGNOSIS — E039 Hypothyroidism, unspecified: Secondary | ICD-10-CM | POA: Insufficient documentation

## 2014-08-20 DIAGNOSIS — E139 Other specified diabetes mellitus without complications: Secondary | ICD-10-CM

## 2014-08-20 LAB — GLUCOSE, POCT (MANUAL RESULT ENTRY): POC Glucose: 100 mg/dl — AB (ref 70–99)

## 2014-08-20 LAB — COMPLETE METABOLIC PANEL WITH GFR
ALT: 57 U/L — ABNORMAL HIGH (ref 0–35)
AST: 58 U/L — AB (ref 0–37)
Albumin: 4.2 g/dL (ref 3.5–5.2)
Alkaline Phosphatase: 105 U/L (ref 39–117)
BUN: 10 mg/dL (ref 6–23)
CALCIUM: 9.1 mg/dL (ref 8.4–10.5)
CHLORIDE: 102 meq/L (ref 96–112)
CO2: 25 meq/L (ref 19–32)
Creat: 0.49 mg/dL — ABNORMAL LOW (ref 0.50–1.10)
GFR, Est African American: >89 mL/min
GFR, Est Non African American: >89 mL/min
Glucose, Bld: 92 mg/dL (ref 70–99)
Potassium: 3.8 mEq/L (ref 3.5–5.3)
Sodium: 140 mEq/L (ref 135–145)
Total Bilirubin: 0.4 mg/dL (ref 0.2–1.2)
Total Protein: 7.6 g/dL (ref 6.0–8.3)

## 2014-08-20 LAB — HEPATITIS B SURFACE ANTIGEN: HEP B S AG: NEGATIVE

## 2014-08-20 LAB — POCT GLYCOSYLATED HEMOGLOBIN (HGB A1C): Hemoglobin A1C: 7

## 2014-08-20 LAB — TSH: TSH: 3.036 u[IU]/mL (ref 0.350–4.500)

## 2014-08-20 LAB — HEPATITIS B SURFACE ANTIBODY,QUALITATIVE: Hep B S Ab: POSITIVE — AB

## 2014-08-20 MED ORDER — LEVOTHYROXINE SODIUM 50 MCG PO TABS: 50.0000 ug | ORAL_TABLET | Freq: Every day | ORAL | Status: DC

## 2014-08-20 NOTE — Progress Notes (Signed)
Patient here with interpreter Patient states she is here for a follow up on her diabetes and thyroid disease Patient is requesting a refill on her thyroid medication

## 2014-08-20 NOTE — Patient Instructions (Signed)
La diabetes mellitus y los alimentos (Diabetes Mellitus and Food) Es importante que controle su nivel de azcar en la sangre (glucosa). El nivel de glucosa en sangre depende en gran medida de lo que usted come. Comer alimentos saludables en las cantidades adecuadas a lo largo del da, aproximadamente a la misma hora todos los das, lo ayudar a controlar su nivel de glucosa en sangre. Tambin puede ayudarlo a retrasar o evitar el empeoramiento de la diabetes mellitus. Comer de manera saludable incluso puede ayudarlo a mejorar el nivel de presin arterial y a alcanzar o mantener un peso saludable.  CMO PUEDEN AFECTARME LOS ALIMENTOS? Carbohidratos Los carbohidratos afectan el nivel de glucosa en sangre ms que cualquier otro tipo de alimento. El nutricionista lo ayudar a determinar cuntos carbohidratos puede consumir en cada comida y ensearle a contarlos. El recuento de carbohidratos es importante para mantener la glucosa en sangre en un nivel saludable, en especial si utiliza insulina o toma determinados medicamentos para la diabetes mellitus. Alcohol El alcohol puede provocar disminuciones sbitas de la glucosa en sangre (hipoglucemia), en especial si utiliza insulina o toma determinados medicamentos para la diabetes mellitus. La hipoglucemia es una afeccin que puede poner en peligro la vida. Los sntomas de la hipoglucemia (somnolencia, mareos y desorientacin) son similares a los sntomas de haber consumido mucho alcohol.  Si el mdico lo autoriza a beber alcohol, hgalo con moderacin y siga estas pautas:  Las mujeres no deben beber ms de un trago por da, y los hombres no deben beber ms de dos tragos por da. Un trago es igual a:  12 onzas (355 ml) de cerveza  5 onzas de vino (150 ml) de vino  1,5onzas (45ml) de bebidas espirituosas  No beba con el estmago vaco.  Mantngase hidratado. Beba agua, gaseosas dietticas o t helado sin azcar.  Las gaseosas comunes, los jugos y  otros refrescos podran contener muchos carbohidratos y se deben contar. QU ALIMENTOS NO SE RECOMIENDAN? Cuando haga las elecciones de alimentos, es importante que recuerde que todos los alimentos son distintos. Algunos tienen menos nutrientes que otros por porcin, aunque podran tener la misma cantidad de caloras o carbohidratos. Es difcil darle al cuerpo lo que necesita cuando consume alimentos con menos nutrientes. Estos son algunos ejemplos de alimentos que debera evitar ya que contienen muchas caloras y carbohidratos, pero pocos nutrientes:  Grasas trans (la mayora de los alimentos procesados incluyen grasas trans en la etiqueta de Informacin nutricional).  Gaseosas comunes.  Jugos.  Caramelos.  Dulces, como tortas, pasteles, rosquillas y galletas.  Comidas fritas. QU ALIMENTOS PUEDO COMER? Consuma alimentos ricos en nutrientes, que nutrirn el cuerpo y lo mantendrn saludable. Los alimentos que debe comer tambin dependern de varios factores, como:  Las caloras que necesita.  Los medicamentos que toma.  Su peso.  El nivel de glucosa en sangre.  El nivel de presin arterial.  El nivel de colesterol. Tambin debe consumir una variedad de alimentos, como:  Protenas, como carne, aves, pescado, tofu, frutos secos y semillas (las protenas de animales magros son mejores).  Frutas.  Verduras.  Productos lcteos, como leche, queso y yogur (descremados son mejores).  Panes, granos, pastas, cereales, arroz y frijoles.  Grasas, como aceite de oliva, margarina sin grasas trans, aceite de canola, aguacate y aceitunas. TODOS LOS QUE PADECEN DIABETES MELLITUS TIENEN EL MISMO PLAN DE COMIDAS? Dado que todas las personas que padecen diabetes mellitus son distintas, no hay un solo plan de comidas que funcione para todos. Es muy   importante que se rena con un nutricionista que lo ayudar a crear un plan de comidas adecuado para usted. Document Released: 05/09/2007  Document Revised: 02/04/2013 Exit[employer] Patient Information 2015 [employer]. This information is not intended to replace advice given to you by your health care provider. Make sure you discuss any questions you have with your health care provider.  

## 2014-08-20 NOTE — Progress Notes (Signed)
MRN: [id number] Name: Kristin Wall  Sex: female Age: 34 y.o. DOB: 08-Dec-1980  Allergies: Review of patient's allergies indicates no known allergies.  Chief Complaint  Patient presents with  . Follow-up    HPI: Patient is 34 y.o. female who has history of diabetes, hypothyroidism, comes today for followup vision is taking metformin 500 mg twice a day, today her hemoglobin A1c is 7.0 % which is slightly increased compared to last visit, as per patient she has been compliant in taking medication, previous blood work reviewed noticed borderline abnormal LFTs, patient does report drinking alcohol, she's advised to cut down on drinking, patient denies any acute symptoms denies any headache dizziness chest and shortness of breath denies any nausea vomiting denies any change in urine or stool color.  Past Medical History  Diagnosis Date  . Amenorrhea     Past Surgical History  Procedure Laterality Date  . Wisdom tooth extraction        Medication List       This list is accurate as of: 08/20/14  4:40 PM.  Always use your most recent med list.               glucose monitoring kit monitoring kit  1 each by Does not apply route as needed for other. Dispense any model that is covered- dispense testing supplies for Q AC/ HS accuchecks- 1 month supply with one refil.     levothyroxine 50 MCG tablet  Commonly known as:  SYNTHROID, LEVOTHROID  Take 1 tablet (50 mcg total) by mouth daily before breakfast.     medroxyPROGESTERone 10 MG tablet  Commonly known as:  PROVERA  Take 1 tablet (10 mg total) by mouth daily.     metFORMIN 500 MG tablet  Commonly known as:  GLUCOPHAGE  Take 1 tablet (500 mg total) by mouth 2 (two) times daily with a meal.     Vitamin D (Ergocalciferol) 50000 UNITS Caps capsule  Commonly known as:  DRISDOL  Take 1 capsule (50,000 Units total) by mouth every 7 (seven) days.        Meds ordered this encounter  Medications  . levothyroxine  (SYNTHROID, LEVOTHROID) 50 MCG tablet    Sig: Take 1 tablet (50 mcg total) by mouth daily before breakfast.    Dispense:  30 tablet    Refill:  3    Immunization History  Administered Date(s) Administered  . Influenza,inj,Quad PF,36+ Mos 01/02/2014    Family History  Problem Relation Age of Onset  . Diabetes Father   . Cancer Paternal Grandmother     History  Substance Use Topics  . Smoking status: Never Smoker   . Smokeless tobacco: Not on file  . Alcohol Use: Yes     Comment: occasionally    Review of Systems   As noted in HPI  Filed Vitals:   08/20/14 1614  BP: 129/86  Pulse: 76  Temp: 98.8 F (37.1 C)  Resp: 16    Physical Exam  Physical Exam  Constitutional: No distress.  Eyes: EOM are normal. Pupils are equal, round, and reactive to light.  Cardiovascular: Normal rate and regular rhythm.   Pulmonary/Chest: Breath sounds normal. No respiratory distress. She has no wheezes. She has no rales.  Musculoskeletal: She exhibits no edema.    CBC    Component Value Date/Time   WBC 6.5 10/14/2013 1702   RBC 5.22* 10/14/2013 1702   HGB 14.6 10/14/2013 1702   HCT 42.0 10/14/2013 1702  PLT 289 10/14/2013 1702   MCV 80.5 10/14/2013 1702   LYMPHSABS 2.5 10/14/2013 1702   MONOABS 0.5 10/14/2013 1702   EOSABS 0.2 10/14/2013 1702   BASOSABS 0.1 10/14/2013 1702    CMP     Component Value Date/Time   NA 137 05/22/2014 0930   K 4.0 05/22/2014 0930   CL 101 05/22/2014 0930   CO2 25 05/22/2014 0930   GLUCOSE 135* 05/22/2014 0930   BUN 12 05/22/2014 0930   CREATININE 0.56 05/22/2014 0930   CALCIUM 9.0 05/22/2014 0930   PROT 7.6 05/22/2014 0930   ALBUMIN 4.2 05/22/2014 0930   AST 41* 05/22/2014 0930   ALT 51* 05/22/2014 0930   ALKPHOS 106 05/22/2014 0930   BILITOT 0.5 05/22/2014 0930   GFRNONAA >89 05/22/2014 0930   GFRAA >89 05/22/2014 0930    No results found for: CHOL  Lab Results  Component Value Date/Time   HGBA1C 7.0 08/20/2014 04:14 PM    HGBA1C 7.5* 10/14/2013 05:02 PM    Lab Results  Component Value Date/Time   AST 41* 05/22/2014 09:30 AM    Assessment and Plan  Other specified diabetes mellitus without complications - Plan:  Results for orders placed or performed in visit on 08/20/14  Glucose (CBG)  Result Value Ref Range   POC Glucose 100.0 (A) 70 - 99 mg/dl  HgB A1c  Result Value Ref Range   Hemoglobin A1C 7.0    Patient is advised for diabetes planning, continue with metformin, repeat A1c in 3 months, we'll check  COMPLETE METABOLIC PANEL WITH GFR  Hypothyroidism, unspecified hypothyroidism type - Plan: currently patient is on levothyroxine (SYNTHROID, LEVOTHROID) 50 MCG tablet, recheck TSH  Abnormal LFTs - Plan: advised patient to cut down on alcohol, will repeat her COMPLETE METABOLIC PANEL WITH GFR, also check Hepatitis B surface antibody, Hepatitis B surface antigen, Hepatitis C RNA quantitative    Return in about 3 months (around 11/20/2014), or if symptoms worsen or fail to improve.   This note has been created with Surveyor, quantity. Any transcriptional errors are unintentional.    Lorayne Marek, MD

## 2014-08-22 LAB — HEPATITIS C RNA QUANTITATIVE: HCV QUANT: NOT DETECTED [IU]/mL (ref <15)

## 2014-08-24 ENCOUNTER — Telehealth: Payer: Self-pay

## 2014-08-24 NOTE — Telephone Encounter (Signed)
In house interpreter used Patient not available Unable to leave message voice mail not set up

## 2014-08-24 NOTE — Telephone Encounter (Signed)
-----   Message from Doris Cheadleeepak Advani, MD sent at 08/24/2014 11:08 AM EDT ----- Call and let the patient know that her TSH level is in normal range, continue with current dose of levothyroxine. Still noted borderline elevated liver enzymes, her hepatitis panel is negative, advise patient to avoid alcohol and Tylenol.

## 2014-12-18 ENCOUNTER — Ambulatory Visit: Payer: No Typology Code available for payment source | Attending: Internal Medicine | Admitting: Internal Medicine

## 2014-12-18 ENCOUNTER — Encounter: Payer: Self-pay | Admitting: Internal Medicine

## 2014-12-18 VITALS — BP 139/85 | HR 71 | Temp 98.0°F | Resp 16 | Wt 229.0 lb

## 2014-12-18 DIAGNOSIS — E119 Type 2 diabetes mellitus without complications: Secondary | ICD-10-CM | POA: Insufficient documentation

## 2014-12-18 DIAGNOSIS — E039 Hypothyroidism, unspecified: Secondary | ICD-10-CM | POA: Insufficient documentation

## 2014-12-18 LAB — LIPID PANEL
CHOL/HDL RATIO: 5.1 ratio — AB (ref <=5.0)
Cholesterol: 204 mg/dL — ABNORMAL HIGH (ref 125–200)
HDL: 40 mg/dL — ABNORMAL LOW (ref >=46)
LDL CALC: 135 mg/dL — AB (ref <130)
Triglycerides: 143 mg/dL (ref <150)
VLDL: 29 mg/dL (ref <30)

## 2014-12-18 LAB — GLUCOSE, POCT (MANUAL RESULT ENTRY): POC Glucose: 136 mg/dl — AB (ref 70–99)

## 2014-12-18 LAB — POCT GLYCOSYLATED HEMOGLOBIN (HGB A1C): Hemoglobin A1C: 7.2

## 2014-12-18 MED ORDER — METFORMIN HCL 500 MG PO TABS: 500.0000 mg | ORAL_TABLET | Freq: Two times a day (BID) | ORAL | Status: DC

## 2014-12-18 MED ORDER — LEVOTHYROXINE SODIUM 50 MCG PO TABS
50.0000 ug | ORAL_TABLET | Freq: Every day | ORAL | Status: DC
Start: 2014-12-18 — End: 2015-05-04

## 2014-12-18 NOTE — Progress Notes (Signed)
Patient here for follow up on her thyroid disease and diabetes Patient also requesting refills on her medications

## 2014-12-18 NOTE — Patient Instructions (Signed)
La diabetes mellitus y los alimentos (Diabetes Mellitus and Food) Es importante que controle su nivel de azcar en la sangre (glucosa). El nivel de glucosa en sangre depende en gran medida de lo que usted come. Comer alimentos saludables en las cantidades [place] a lo largo del Training and development officer, aproximadamente a la misma hora [employer], lo ayudar a Chief Technology Officer su nivel de Multimedia programmer. Tambin puede ayudarlo a retrasar o Patent attorney de la diabetes mellitus. Comer de [employer] saludable incluso puede ayudarlo a Chartered loss adjuster de presin arterial y a Science writer o Theatre manager un peso saludable.  Entre las recomendaciones generales para alimentarse y Audiological scientist los alimentos de forma saludable, se incluyen las siguientes:  Respetar las comidas principales y comer colaciones con regularidad. Evitar pasar largos perodos sin comer con el fin de perder peso.  Seguir una dieta que consista principalmente en alimentos de origen vegetal, como frutas, vegetales, frutos secos, legumbres y cereales integrales.  Utilizar mtodos de coccin a baja temperatura, como hornear, en lugar de mtodos de coccin a alta temperatura, como frer en abundante aceite. Trabaje con el nutricionista para aprender a Financial planner nutricional de las etiquetas de los alimentos. CMO PUEDEN AFECTARME LOS ALIMENTOS? Carbohidratos Los carbohidratos afectan el nivel de glucosa en sangre ms que cualquier otro tipo de alimento. El nutricionista lo ayudar a Teacher, adult education cuntos carbohidratos puede consumir en cada comida y ensearle a contarlos. El recuento de carbohidratos es importante para mantener la glucosa en sangre en un nivel saludable, en especial si utiliza insulina o toma determinados medicamentos para la diabetes mellitus. Alcohol El alcohol puede provocar disminuciones sbitas de la glucosa en sangre (hipoglucemia), en especial si utiliza insulina o toma determinados medicamentos para la diabetes mellitus. La  hipoglucemia es una afeccin que puede poner en peligro la vida. Los sntomas de la hipoglucemia (somnolencia, mareos y Data processing manager) son similares a los sntomas de haber consumido mucho alcohol.  Si el mdico lo autoriza a beber alcohol, hgalo con moderacin y siga estas pautas:  Las mujeres no deben beber ms de un trago por da, y los hombres no deben beber ms de dos tragos por Training and development officer. Un trago es igual a:  12 onzas (355 ml) de cerveza  5 onzas de vino (150 ml) de vino  1,5onzas (23m) de bebidas espirituosas  No beba con el estmago vaco.  Mantngase hidratado. Beba agua, gaseosas dietticas o t helado sin azcar.  Las gaseosas comunes, los jugos y otros refrescos podran contener muchos carbohidratos y se dCivil Service fast streamer QU ALIMENTOS NO SE RECOMIENDAN? Cuando haga las elecciones de alimentos, es importante que recuerde que todos los alimentos son distintos. Algunos tienen menos nutrientes que otros por porcin, aunque podran tener la misma cantidad de caloras o carbohidratos. Es difcil darle al cuerpo lo que necesita cuando consume alimentos con menos nutrientes. Estos son algunos ejemplos de alimentos que debera evitar ya que contienen muchas caloras y carbohidratos, pero pocos nutrientes:  GPhysicist, medicaltrans (la mayora de los alimentos procesados incluyen grasas trans en la etiqueta de Informacin nutricional).  Gaseosas comunes.  Jugos.  Caramelos.  Dulces, como tortas, pasteles, rosquillas y [place]  Comidas fritas. QU ALIMENTOS PUEDO COMER? Consuma alimentos ricos en nutrientes, que nutrirn el cuerpo y lo mantendrn saludable. Los alimentos que debe comer tambin dependern de varios factores, como:  Las caloras que necesita.  Los medicamentos que toma.  Su peso.  El nivel de glucosa en sMarist College  El nArrow Rockde presin arterial.  El nivel de colesterol.  Debe consumir una amplia variedad de alimentos, por ejemplo:  Protenas.  Cortes de [employer].  Protenas con bajo contenido de grasas saturadas, como pescado, clara de huevo y frijoles. Evite las carnes procesadas.  Frutas y vegetales.  Frutas y Sports administrator que pueden ayudar a Chief Operating Officer los niveles sanguneos de [place], como [place], mangos y batatas.  Productos lcteos.  Elija productos lcteos sin grasa o con bajo contenido de [place], como [place], yogur y [place].  Cereales, panes, pastas y arroz.  Elija cereales integrales, como panes multicereales, avena en grano y arroz integral. Estos alimentos pueden ayudar a controlar la presin arterial.  Rosalin Hawking.  Alimentos que contengan grasas saludables, como frutos secos, Chartered certified accountant, aceite de [place], aceite de canola y pescado. TODOS LOS QUE PADECEN DIABETES MELLITUS TIENEN EL MISMO PLAN DE COMIDAS? Dado que todas las personas que padecen diabetes mellitus son distintas, no hay un solo plan de comidas que funcione para todos. Es muy importante que se rena con un nutricionista que lo ayudar a crear un plan de comidas adecuado para usted.   Esta informacin no tiene Theme park manager el consejo del mdico. Asegrese de hacerle al mdico cualquier pregunta que tenga.   Document Released: 05/09/2007 Document Revised: 02/20/2014 Elsevier Interactive Patient Education 2016 [employer]. Recuento bsico de carbohidratos para la diabetes mellitus (Basic Carbohydrate Counting for Diabetes Mellitus) El recuento de carbohidratos es un mtodo destinado a calcular la cantidad de carbohidratos en la dieta. El consumo de carbohidratos aumenta naturalmente el nivel de azcar (glucosa) en la sangre, por lo que es importante que sepa la cantidad que debe incluir en cada comida. El recuento de carbohidratos ayuda a Futures trader de glucosa en la sangre dentro de los lmites normales. La cantidad permitida de carbohidratos es diferente para cada persona. Un nutricionista puede ayudarlo a calcular la cantidad adecuada para usted. Una vez que  sepa la cantidad de carbohidratos que puede consumir, podr calcular los carbohidratos de los alimentos que desea comer. Los siguientes alimentos incluyen carbohidratos:  Granos, como panes y cereales.  Frijoles secos y productos con soja.  Vegetales almidonados, como papas, guisantes y maz.  Nils Pyle y jugos de frutas.  Leche y Dentist.  Dulces y bocadillos, como pastel, galletas, caramelos, papas fritas de bolsa, refrescos y bebidas frutales con azcar. RECUENTO DE CARBOHIDRATOS [employer] de calcular los carbohidratos de los alimentos. Puede usar cualquiera de [address] o [place] combinacin de [place]. Leer la etiqueta de informacin nutricional de los alimentos envasados La informacin nutricional es una etiqueta incluida en casi todas las bebidas y los alimentos envasados de los [place]. Indica el tamao de la porcin de ese alimento o bebida e informacin sobre los nutrientes de cada porcin, incluso los gramos (g) de carbohidratos por porcin.  Decida la cantidad de porciones que comer o tomar de este alimento o bebida. Multiplique la cantidad de porciones por el nmero de gramos de carbohidratos indicados en la etiqueta para esa porcin. El total ser la cantidad de carbohidratos que consumir al comer ese alimento o tomar esa bebida. Conocer las porciones estndar de los alimentos Cuando coma alimentos no envasados o que no incluyan la informacin nutricional en la etiqueta, deber medir las porciones para poder calcular la cantidad de carbohidratos. Una porcin de la mayora de los alimentos ricos en carbohidratos contiene alrededor de 15g de carbohidratos. La siguiente [employer] tamaos de porcin de los alimentos ricos en carbohidratos que contienen alrededor de 15g de carbohidratos por  porcin:   1rebanada de pan (1oz) o 1tortilla de seis pulgadas.  panecillo de hamburguesa o bollito tipo ingls.  4a 6galletas.   de taza de cereal sin azcar y  seco.   taza de cereal caliente.   de taza de arroz o pastas.  taza de pur de papas o de una papa grande al horno.  1taza de frutas frescas o una fruta pequea.  taza de frutas o jugo de frutas enlatados o congelados.  1 taza AutoZonede leche.   de taza de yogur descremado sin ningn agregado o de yogur endulzado con edulcorante artificial.  taza de vegetales almidonados, como guisantes, maz o papas, o de frijoles secos cocidos. Decida la cantidad de porciones Advertising copywriterestndar que comer. Multiplique la cantidad de porciones por 15 (los gramos de carbohidratos en esa porcin). Por ejemplo, si come 2tazas de fresas, habr comido 2porciones y 30g de carbohidratos (2porciones x 15g = 30g). Para las comidas como sopas y guisos, en las que se mezcla ms de un alimento, deber [place] Northern Santa Fecontar los carbohidratos de cada alimento incluido. EJEMPLO DE RECUENTO DE CARBOHIDRATOS Ejemplo de cena  3 onzas de pechugas de pollo.   de taza de arroz integral.   taza de maz.  1 taza de Benitezleche.  1 taza de fresas con crema batida sin azcar. Clculo de carbohidratos [place] 1: Identifique los alimentos que contienen carbohidratos:   Arroz.  Maz.  Leche.  Jinny SandersFresas. [place] 2: Calcule el nmero de porciones que consumir de cada uno:   2 porciones de Surveyor, mineralsarroz.  1 porcin de maz.  1 porcin de leche.  1 porcin de fresas. [place] 3: Multiplique cada una de esas porciones por 15g:   2 porciones de arroz x 15 g = 30 g.  1 porcin de maz x 15 g = 15 g.  1 porcin de leche x 15 g = 15 g.  1 porcin de fresas x 15 g = 15 g. [place] 4: Sume todas las cantidades para Artistconocer el total de gramos de carbohidratos consumidos: 30 g + 15 g + 15 g + 15 g = 75 g.   Esta informacin no tiene Theme park managercomo fin reemplazar el consejo del mdico. Asegrese de hacerle al mdico cualquier pregunta que tenga.   Document Released: 04/24/2011 Document Revised: 02/20/2014 Elsevier Interactive Patient Education [employer]2016 Elsevier  Inc.

## 2014-12-18 NOTE — Progress Notes (Signed)
Patient ID: Kristin Wall, female   DOB: [birth year], 34 y.o.   MRN: [id number] SUBJECTIVE: 34 y.o. female for follow up of diabetes and thyroid disease.  Patient currently takes Metformin and Synthroid dialy without complications. Diabetic Review of Systems - medication compliance: compliant all of the time, diabetic diet compliance: noncompliant some of the time, home glucose monitoring: is performed regularly, further diabetic ROS: no polyuria or polydipsia, no chest pain, dyspnea or TIA's, no numbness, tingling or pain in extremities, no unusual visual symptoms, no hypoglycemia.  Other symptoms and concerns: none.  Current Outpatient Prescriptions  Medication Sig Dispense Refill  . levothyroxine (SYNTHROID, LEVOTHROID) 50 MCG tablet Take 1 tablet (50 mcg total) by mouth daily before breakfast. 30 tablet 3  . metFORMIN (GLUCOPHAGE) 500 MG tablet Take 1 tablet (500 mg total) by mouth 2 (two) times daily with a meal. 180 tablet 3  . glucose monitoring kit (FREESTYLE) monitoring kit 1 each by Does not apply route as needed for other. Dispense any model that is covered- dispense testing supplies for Q AC/ HS accuchecks- 1 month supply with one refil. 1 each 1  . medroxyPROGESTERone (PROVERA) 10 MG tablet Take 1 tablet (10 mg total) by mouth daily. 10 tablet 3  . Vitamin D, Ergocalciferol, (DRISDOL) 50000 UNITS CAPS capsule Take 1 capsule (50,000 Units total) by mouth every 7 (seven) days. 12 capsule 0   No current facility-administered medications for this visit.  Review of Systems Other than what is stated in HPI, all other systems are negative.   OBJECTIVE: Appearance: alert, well appearing, and in no distress, oriented to person, place, and time and overweight. BP 139/85 mmHg  Pulse 71  Temp(Src) 98 F (36.7 C)  Resp 16  Wt 229 lb (103.874 kg)  SpO2 100%  Exam: heart sounds normal rate, regular rhythm, normal S1, S2, no murmurs, rubs, clicks or gallops, chest clear, no carotid bruits,  feet: warm, good capillary refill, no trophic changes or ulcerative lesions, normal DP and PT pulses, normal monofilament exam and normal sensory exam  ASSESSMENT: Diabetes Mellitus: well controlled and needs to follow diet more regularly.  Hypothyroidism: levels just checked 4 months ago and were normal. Meds refilled  PLAN: See orders for this visit as documented in the electronic medical record. Issues reviewed with her: diabetic diet discussed in detail, written exchange diet given, foot care discussed and Podiatry visits discussed, annual eye examinations at Ophthalmology discussed and long term diabetic complications discussed.  Due to language barrier, an interpreter was present during the history-taking and subsequent discussion (and for part of the physical exam) with this patient.  Return in about 3 months (around 03/20/2015) for Diabetes Mellitus.    Lance Bosch, NP 12/18/2014 11:54 AM

## 2014-12-19 LAB — MICROALBUMIN, URINE: MICROALB UR: 2.9 mg/dL

## 2014-12-22 ENCOUNTER — Ambulatory Visit: Payer: No Typology Code available for payment source | Attending: Internal Medicine

## 2015-01-04 ENCOUNTER — Other Ambulatory Visit: Payer: Self-pay | Admitting: Internal Medicine

## 2015-01-04 DIAGNOSIS — E785 Hyperlipidemia, unspecified: Secondary | ICD-10-CM

## 2015-01-04 MED ORDER — ATORVASTATIN CALCIUM 10 MG PO TABS: 10.0000 mg | ORAL_TABLET | Freq: Every day | ORAL | Status: DC

## 2015-01-05 ENCOUNTER — Telehealth: Payer: Self-pay

## 2015-01-05 NOTE — Telephone Encounter (Signed)
Patient not available Unable to leave message  Voice mail not set up 

## 2015-01-05 NOTE — Telephone Encounter (Signed)
-----   Message from Ambrose FinlandValerie A Keck, NP sent at 01/04/2015  5:06 PM EST ----- Cholesterol is elevated. Please go over things that increase cholesterol levels such as breads pasta, rice, butters, fried foods, etc. I have prescribed her Lipitor 10 mg to take every evening with dinner. Please explain that high cholesterol places her at risk for stroke and heart disease

## 2015-04-21 ENCOUNTER — Encounter: Payer: Self-pay | Admitting: Internal Medicine

## 2015-04-21 ENCOUNTER — Ambulatory Visit: Payer: No Typology Code available for payment source | Attending: Internal Medicine | Admitting: Internal Medicine

## 2015-04-21 VITALS — BP 130/82 | HR 76 | Temp 98.0°F | Resp 16 | Ht 63.0 in | Wt 228.0 lb

## 2015-04-21 DIAGNOSIS — E079 Disorder of thyroid, unspecified: Secondary | ICD-10-CM | POA: Insufficient documentation

## 2015-04-21 DIAGNOSIS — E119 Type 2 diabetes mellitus without complications: Secondary | ICD-10-CM

## 2015-04-21 DIAGNOSIS — Z809 Family history of malignant neoplasm, unspecified: Secondary | ICD-10-CM | POA: Insufficient documentation

## 2015-04-21 DIAGNOSIS — Z833 Family history of diabetes mellitus: Secondary | ICD-10-CM | POA: Insufficient documentation

## 2015-04-21 DIAGNOSIS — Z79899 Other long term (current) drug therapy: Secondary | ICD-10-CM | POA: Insufficient documentation

## 2015-04-21 DIAGNOSIS — E039 Hypothyroidism, unspecified: Secondary | ICD-10-CM

## 2015-04-21 DIAGNOSIS — Z23 Encounter for immunization: Secondary | ICD-10-CM

## 2015-04-21 DIAGNOSIS — E785 Hyperlipidemia, unspecified: Secondary | ICD-10-CM

## 2015-04-21 DIAGNOSIS — Z7984 Long term (current) use of oral hypoglycemic drugs: Secondary | ICD-10-CM | POA: Insufficient documentation

## 2015-04-21 LAB — POCT GLYCOSYLATED HEMOGLOBIN (HGB A1C): Hemoglobin A1C: 7.3

## 2015-04-21 LAB — BASIC METABOLIC PANEL
BUN: 10 mg/dL (ref 7–25)
CALCIUM: 9 mg/dL (ref 8.6–10.2)
CHLORIDE: 101 mmol/L (ref 98–110)
CO2: 28 mmol/L (ref 20–31)
CREATININE: 0.6 mg/dL (ref 0.50–1.10)
GLUCOSE: 144 mg/dL — AB (ref 65–99)
Potassium: 4.3 mmol/L (ref 3.5–5.3)
Sodium: 137 mmol/L (ref 135–146)

## 2015-04-21 LAB — TSH: TSH: 1.94 mIU/L

## 2015-04-21 LAB — T4, FREE: Free T4: 1.2 ng/dL (ref 0.8–1.8)

## 2015-04-21 MED ORDER — METFORMIN HCL 500 MG PO TABS: 500.0000 mg | ORAL_TABLET | Freq: Two times a day (BID) | ORAL | Status: DC

## 2015-04-21 MED ORDER — ATORVASTATIN CALCIUM 10 MG PO TABS: 10.0000 mg | ORAL_TABLET | Freq: Every day | ORAL | Status: DC

## 2015-04-21 NOTE — Progress Notes (Signed)
Patient here for follow up on her diabetes and thyroid disease Patient also requesting refills on her medications

## 2015-04-21 NOTE — Progress Notes (Signed)
Patient ID: Kristin Wall, female   DOB: 11-Jul-1980, 35 y.o.   MRN: [id number]  CC: diabetes, thyroid disease  HPI: Kristin Wall is a 35 y.o. female here today for a follow up visit.  Patient has past medical history of thyroid disease, diabetes, and HLD. Patient reports that she has only been taking synthroid and Metformin without complications. Patient reports that she was unaware she was suppose to start medication for her cholesterol. She has not started a exercise program.  Patient denies any complications with medications.  No Known Allergies Past Medical History  Diagnosis Date  . Amenorrhea    Current Outpatient Prescriptions on File Prior to Visit  Medication Sig Dispense Refill  . levothyroxine (SYNTHROID, LEVOTHROID) 50 MCG tablet Take 1 tablet (50 mcg total) by mouth daily before breakfast. 30 tablet 3  . metFORMIN (GLUCOPHAGE) 500 MG tablet Take 1 tablet (500 mg total) by mouth 2 (two) times daily with a meal. 180 tablet 3  . atorvastatin (LIPITOR) 10 MG tablet Take 1 tablet (10 mg total) by mouth daily. (Patient not taking: Reported on 04/21/2015) 90 tablet 3  . glucose monitoring kit (FREESTYLE) monitoring kit 1 each by Does not apply route as needed for other. Dispense any model that is covered- dispense testing supplies for Q AC/ HS accuchecks- 1 month supply with one refil. 1 each 1  . medroxyPROGESTERone (PROVERA) 10 MG tablet Take 1 tablet (10 mg total) by mouth daily. 10 tablet 3  . Vitamin D, Ergocalciferol, (DRISDOL) 50000 UNITS CAPS capsule Take 1 capsule (50,000 Units total) by mouth every 7 (seven) days. 12 capsule 0   No current facility-administered medications on file prior to visit.   Family History  Problem Relation Age of Onset  . Diabetes Father   . Cancer Paternal Grandmother    Social History   Social History  . Marital Status: Single    Spouse Name: N/A  . Number of Children: N/A  . Years of Education: N/A   Occupational History  .  Not on file.   Social History Main Topics  . Smoking status: Never Smoker   . Smokeless tobacco: Not on file  . Alcohol Use: Yes     Comment: occasionally  . Drug Use: No  . Sexual Activity: Yes    Birth Control/ Protection: None   Other Topics Concern  . Not on file   Social History Narrative    Review of Systems  Eyes: Negative for blurred vision.  Cardiovascular: Negative for chest pain and claudication.  Genitourinary: Negative for frequency.  Neurological: Negative for dizziness and headaches.  Endo/Heme/Allergies: Negative for polydipsia.  All other systems reviewed and are negative.  Objective:   Filed Vitals:   04/21/15 1013  BP: 130/82  Pulse: 76  Temp: 98 F (36.7 C)  Resp: 16    Physical Exam  Constitutional: She is oriented to person, place, and time.  Cardiovascular: Normal rate, regular rhythm and normal heart sounds.   Pulmonary/Chest: Effort normal and breath sounds normal.  Neurological: She is alert and oriented to person, place, and time.  Psychiatric: She has a normal mood and affect.     Lab Results  Component Value Date   WBC 6.5 10/14/2013   HGB 14.6 10/14/2013   HCT 42.0 10/14/2013   MCV 80.5 10/14/2013   PLT 289 10/14/2013   Lab Results  Component Value Date   CREATININE 0.49* 08/20/2014   BUN 10 08/20/2014   NA 140 08/20/2014   K 3.8  08/20/2014   CL 102 08/20/2014   CO2 25 08/20/2014    Lab Results  Component Value Date   HGBA1C 7.3 04/21/2015   Lipid Panel     Component Value Date/Time   CHOL 204* 12/18/2014 1016   TRIG 143 12/18/2014 1016   HDL 40* 12/18/2014 1016   CHOLHDL 5.1* 12/18/2014 1016   VLDL 29 12/18/2014 1016   LDLCALC 135* 12/18/2014 1016       Assessment and plan:   Kristin Wall was seen today for follow-up.  Diagnoses and all orders for this visit:  Type 2 diabetes mellitus without complication, without long-term current use of insulin (HCC) -     Glucose (CBG) -     HgB A1c -    Refill metFORMIN  (GLUCOPHAGE) 500 MG tablet; Take 1 tablet (500 mg total) by mouth 2 (two) times daily with a meal. -     Basic Metabolic Panel Patients diabetes is well control as evidence by consistently low a1c.  Patient will continue with current therapy and continue to make necessary lifestyle changes.  Reviewed foot care, diet, exercise, annual health maintenance with patient.   Dyslipidemia -     atorvastatin (LIPITOR) 10 MG tablet; Take 1 tablet (10 mg total) by mouth daily. Education provided on proper lifestyle changes in order to lower cholesterol. Patient advised to maintain healthy weight and to keep total fat intake at 25-35% of total calories and carbohydrates 50-60% of total daily calories. Explained how high cholesterol places patient at risk for heart disease. Patient placed on appropriate medication and repeat labs in 6 months   Hypothyroidism, unspecified hypothyroidism type -     TSH -     T4, Free Will send refills as appropriate   Need for Tdap vaccination -     Tdap vaccine greater than or equal to 7yo IM   Due to language barrier, an interpreter was present during the history-taking and subsequent discussion (and for part of the physical exam) with this patient.  Return in about 3 months (around 07/22/2015) for Diabetes Mellitus.       Lance Bosch, [facility] [phone] 04/21/2015, 10:33 AM

## 2015-04-27 ENCOUNTER — Telehealth: Payer: Self-pay

## 2015-04-27 NOTE — Telephone Encounter (Signed)
Interpreter line used [employer] ID# [phone] [id number] Patient not available Unable to leave voice mail-not set up at this time

## 2015-04-27 NOTE — Telephone Encounter (Signed)
-----   Message from Ambrose FinlandValerie A Keck, NP sent at 04/26/2015 10:03 PM EDT ----- Labs normal. May refill current dose of synthroid

## 2015-05-04 ENCOUNTER — Telehealth: Payer: Self-pay

## 2015-05-04 ENCOUNTER — Telehealth: Payer: Self-pay | Admitting: Internal Medicine

## 2015-05-04 ENCOUNTER — Other Ambulatory Visit: Payer: Self-pay | Admitting: Internal Medicine

## 2015-05-04 NOTE — Telephone Encounter (Signed)
[facility] Please follow up.

## 2015-05-04 NOTE — Telephone Encounter (Signed)
Interpreter line used Milta DeitersMaria ID# [id number] Returned patient phone call Patient not available Message stated the person you are tying to contact is not accepting  Calls at this time

## 2015-05-07 NOTE — Telephone Encounter (Signed)
Patient returned nurse phone call.

## 2015-05-13 MED FILL — ATORVASTATIN 10 MG TABLET: 10 | 30 days supply | Qty: 30 | Fill #0

## 2015-05-13 MED FILL — LEVOTHYROXINE 50 MCG TABLET: 50 | 30 days supply | Qty: 30 | Fill #0

## 2015-06-10 MED FILL — ?ATORVASTATIN 10 MG TABLET: 10 | 30 days supply | Qty: 30 | Fill #1

## 2015-06-10 MED FILL — LEVOTHYROXINE 50 MCG TABLET: 50 | 30 days supply | Qty: 30 | Fill #1

## 2015-07-08 MED FILL — ?ATORVASTATIN 10 MG TABLET: 10 | 30 days supply | Qty: 30 | Fill #2

## 2015-07-08 MED FILL — LEVOTHYROXINE 50 MCG TABLET: 50 | 30 days supply | Qty: 30 | Fill #2

## 2015-08-19 MED FILL — LEVOTHYROXINE 50 MCG TABLET: 50 | 30 days supply | Qty: 30 | Fill #3

## 2015-08-19 MED FILL — ?ATORVASTATIN 10 MG TABLET: 10 | 30 days supply | Qty: 30 | Fill #3

## 2015-09-21 ENCOUNTER — Telehealth: Payer: Self-pay | Admitting: Internal Medicine

## 2015-09-21 MED ORDER — LEVOTHYROXINE SODIUM 50 MCG PO TABS: 50.0000 ug | ORAL_TABLET | Freq: Every day | ORAL | 2 refills | Status: DC

## 2015-09-21 NOTE — Telephone Encounter (Signed)
Levothyroxine refilled

## 2015-09-21 NOTE — Telephone Encounter (Signed)
Pt. Called requesting a refill for levothyroxine (SYNTHROID, LEVOTHROID) 50 MCG tablet Please f/u with pt.

## 2015-10-13 ENCOUNTER — Encounter: Payer: Self-pay | Admitting: Internal Medicine

## 2015-10-13 ENCOUNTER — Ambulatory Visit: Payer: Self-pay | Attending: Internal Medicine | Admitting: Internal Medicine

## 2015-10-13 VITALS — BP 132/90 | HR 69 | Temp 98.6°F | Resp 16 | Wt 228.6 lb

## 2015-10-13 DIAGNOSIS — Z114 Encounter for screening for human immunodeficiency virus [HIV]: Secondary | ICD-10-CM

## 2015-10-13 DIAGNOSIS — E1165 Type 2 diabetes mellitus with hyperglycemia: Secondary | ICD-10-CM | POA: Insufficient documentation

## 2015-10-13 DIAGNOSIS — Z7984 Long term (current) use of oral hypoglycemic drugs: Secondary | ICD-10-CM | POA: Insufficient documentation

## 2015-10-13 DIAGNOSIS — Z833 Family history of diabetes mellitus: Secondary | ICD-10-CM | POA: Insufficient documentation

## 2015-10-13 DIAGNOSIS — Z23 Encounter for immunization: Secondary | ICD-10-CM | POA: Insufficient documentation

## 2015-10-13 DIAGNOSIS — N912 Amenorrhea, unspecified: Secondary | ICD-10-CM | POA: Insufficient documentation

## 2015-10-13 DIAGNOSIS — Z79899 Other long term (current) drug therapy: Secondary | ICD-10-CM | POA: Insufficient documentation

## 2015-10-13 DIAGNOSIS — E0865 Diabetes mellitus due to underlying condition with hyperglycemia: Secondary | ICD-10-CM

## 2015-10-13 DIAGNOSIS — E119 Type 2 diabetes mellitus without complications: Secondary | ICD-10-CM

## 2015-10-13 DIAGNOSIS — Z809 Family history of malignant neoplasm, unspecified: Secondary | ICD-10-CM | POA: Insufficient documentation

## 2015-10-13 DIAGNOSIS — E785 Hyperlipidemia, unspecified: Secondary | ICD-10-CM | POA: Insufficient documentation

## 2015-10-13 DIAGNOSIS — E039 Hypothyroidism, unspecified: Secondary | ICD-10-CM | POA: Insufficient documentation

## 2015-10-13 LAB — CBC WITH DIFFERENTIAL/PLATELET
BASOS PCT: 1 %
Basophils Absolute: 70 cells/uL (ref 0–200)
EOS ABS: 140 {cells}/uL (ref 15–500)
Eosinophils Relative: 2 %
HCT: 41.9 % (ref 35.0–45.0)
Hemoglobin: 14 g/dL (ref 11.7–15.5)
LYMPHS ABS: 2520 {cells}/uL (ref 850–3900)
LYMPHS PCT: 36 %
MCH: 26.5 pg — ABNORMAL LOW (ref 27.0–33.0)
MCHC: 33.4 g/dL (ref 32.0–36.0)
MCV: 79.2 fL — AB (ref 80.0–100.0)
MONO ABS: 350 {cells}/uL (ref 200–950)
MONOS PCT: 5 %
MPV: 9.7 fL (ref 7.5–12.5)
Neutro Abs: 3920 cells/uL (ref 1500–7800)
Neutrophils Relative %: 56 %
PLATELETS: 315 10*3/uL (ref 140–400)
RBC: 5.29 MIL/uL — AB (ref 3.80–5.10)
RDW: 13.7 % (ref 11.0–15.0)
WBC: 7 10*3/uL (ref 3.8–10.8)

## 2015-10-13 LAB — LIPID PANEL
CHOL/HDL RATIO: 4.8 ratio (ref <=5.0)
Cholesterol: 208 mg/dL — ABNORMAL HIGH (ref 125–200)
HDL: 43 mg/dL — AB (ref >=46)
LDL Cholesterol: 132 mg/dL — ABNORMAL HIGH (ref <130)
TRIGLYCERIDES: 165 mg/dL — AB (ref <150)
VLDL: 33 mg/dL — ABNORMAL HIGH (ref <30)

## 2015-10-13 LAB — GLUCOSE, POCT (MANUAL RESULT ENTRY): POC Glucose: 154 mg/dl — AB (ref 70–99)

## 2015-10-13 LAB — HIV ANTIBODY (ROUTINE TESTING W REFLEX): HIV 1&2 Ab, 4th Generation: NONREACTIVE

## 2015-10-13 LAB — TSH: TSH: 3.98 mIU/L

## 2015-10-13 LAB — POCT GLYCOSYLATED HEMOGLOBIN (HGB A1C): Hemoglobin A1C: 6.9

## 2015-10-13 MED ORDER — METFORMIN HCL 500 MG PO TABS: 500.0000 mg | ORAL_TABLET | Freq: Two times a day (BID) | ORAL | 3 refills | Status: DC

## 2015-10-13 MED ORDER — ATORVASTATIN CALCIUM 10 MG PO TABS: 10.0000 mg | ORAL_TABLET | Freq: Every day | ORAL | 3 refills | Status: DC

## 2015-10-13 NOTE — Patient Instructions (Addendum)
La diabetes mellitus y los alimentos (Diabetes Mellitus and Food) Es importante que controle su nivel de azcar en la sangre (glucosa). El nivel de glucosa en sangre depende en gran medida de lo que usted come. Comer alimentos saludables en las cantidades [place] a lo largo del Training and development officer, aproximadamente a la misma hora [employer], lo ayudar a Chief Technology Officer su nivel de Multimedia programmer. Tambin puede ayudarlo a retrasar o Patent attorney de la diabetes mellitus. Comer de [employer] Services saludable incluso puede ayudarlo a Chartered loss adjuster de presin arterial y a Science writer o Theatre manager un peso saludable.  Entre las recomendaciones generales para alimentarse y Audiological scientist los alimentos de forma saludable, se incluyen las siguientes:  Respetar las comidas principales y comer colaciones con regularidad. Evitar pasar largos perodos sin comer con el fin de perder peso.  Seguir una dieta que consista principalmente en alimentos de origen vegetal, como frutas, vegetales, frutos secos, legumbres y cereales integrales.  Utilizar mtodos de coccin a baja temperatura, como hornear, en lugar de mtodos de coccin a alta temperatura, como frer en abundante aceite. Trabaje con el nutricionista para aprender a Financial planner nutricional de las etiquetas de los alimentos. CMO PUEDEN AFECTARME LOS ALIMENTOS? Carbohidratos Los carbohidratos afectan el nivel de glucosa en sangre ms que cualquier otro tipo de alimento. El nutricionista lo ayudar a Teacher, adult education cuntos carbohidratos puede consumir en cada comida y ensearle a contarlos. El recuento de carbohidratos es importante para mantener la glucosa en sangre en un nivel saludable, en especial si utiliza insulina o toma determinados medicamentos para la diabetes mellitus. Alcohol El alcohol puede provocar disminuciones sbitas de la glucosa en sangre (hipoglucemia), en especial si utiliza insulina o toma determinados medicamentos para la diabetes mellitus. La  hipoglucemia es una afeccin que puede poner en peligro la vida. Los sntomas de la hipoglucemia (somnolencia, mareos y Data processing manager) son similares a los sntomas de haber consumido mucho alcohol.  Si el mdico lo autoriza a beber alcohol, hgalo con moderacin y siga estas pautas:  Las mujeres no deben beber ms de un trago por da, y los hombres no deben beber ms de dos tragos por Training and development officer. Un trago es igual a:  12 onzas (355 ml) de cerveza  5 onzas de vino (150 ml) de vino  1,5onzas (23m) de bebidas espirituosas  No beba con el estmago vaco.  Mantngase hidratado. Beba agua, gaseosas dietticas o t helado sin azcar.  Las gaseosas comunes, los jugos y otros refrescos podran contener muchos carbohidratos y se dCivil Service fast streamer QU ALIMENTOS NO SE RECOMIENDAN? Cuando haga las elecciones de alimentos, es importante que recuerde que todos los alimentos son distintos. Algunos tienen menos nutrientes que otros por porcin, aunque podran tener la misma cantidad de caloras o carbohidratos. Es difcil darle al cuerpo lo que necesita cuando consume alimentos con menos nutrientes. Estos son algunos ejemplos de alimentos que debera evitar ya que contienen muchas caloras y carbohidratos, pero pocos nutrientes:  GPhysicist, medicaltrans (la mayora de los alimentos procesados incluyen grasas trans en la etiqueta de Informacin nutricional).  Gaseosas comunes.  Jugos.  Caramelos.  Dulces, como tortas, pasteles, rosquillas y g[place] Val[place]  Comidas fritas. QU ALIMENTOS PUEDO COMER? Consuma alimentos ricos en nutrientes, que nutrirn el cuerpo y lo mantendrn saludable. Los alimentos que debe comer tambin dependern de varios factores, como:  Las caloras que necesita.  Los medicamentos que toma.  Su peso.  El nivel de glucosa en sMarist College  El nArrow Rockde presin arterial.  El nivel de colesterol.  Debe consumir una amplia variedad de alimentos, por ejemplo:  Protenas.  Cortes de PPL Corporationcarne  magros.  Protenas con bajo contenido de grasas saturadas, como pescado, clara de huevo y frijoles. Evite las carnes procesadas.  Frutas y vegetales.  Frutas y Sports administratorvegetales que pueden ayudar a Chief Operating Officercontrolar los niveles sanguneos de Turleyglucosa, como Sac Citymanzanas, mangos y batatas.  Productos lcteos.  Elija productos lcteos sin grasa o con bajo contenido de [place]grasa, como [place], yogur y Menandsqueso.  Cereales, panes, pastas y arroz.  Elija cereales integrales, como panes multicereales, avena en grano y arroz integral. Estos alimentos pueden ayudar a controlar la presin arterial.  Rosalin HawkingGrasas.  Alimentos que contengan grasas saludables, como frutos secos, Chartered certified accountantaguacate, aceite de [place]arts[place]oliva, aceite de canola y pescado. TODOS LOS QUE PADECEN DIABETES MELLITUS TIENEN EL MISMO PLAN DE COMIDAS? Dado que todas las personas que padecen diabetes mellitus son distintas, no hay un solo plan de comidas que funcione para todos. Es muy importante que se rena con un nutricionista que lo ayudar a crear un plan de comidas adecuado para usted.   Esta informacin no tiene Theme park managercomo fin reemplazar el consejo del mdico. Asegrese de hacerle al mdico cualquier pregunta que tenga.    Document Released: 05/09/2007 Document Revised: 02/20/2014 Elsevier Interactive Patient Education 2016 [employer]vier Inc.  - Consejos para comer fuera de su casa si tiene diabetes (Tips for Eating Away From Home If You Have Diabetes) El control del nivel de glucemia, que tambin se conoce como azcar en la Harmonysangre, puede ser un reto, que se complica an ms cuando uno no prepara sus propias comidas. Los siguientes consejos pueden ayudarlo a Chief Operating Officercontrolar la diabetes cuando come fuera de su casa. PLANIFICACIN Organcese si sabe que comer fuera de su casa:  Pregntele al mdico cmo sincronizar las comidas y el medicamento si est en tratamiento con insulina.  Haga una lista de restaurantes cercanos que ofrezcan opciones saludables. Si tienen un men que pueda leer  en su casa, llvelo y planifique lo que pedir con anticipacin.  Busque informacin en lnea del restaurante donde quiera comer. Muchos restaurantes de comida rpida y cadenas de restaurantes incluyen la informacin nutricional en lnea. Tenga en cuenta esta informacin para elegir las opciones ms saludables y calcular los carbohidratos de la comida.  Use un libro de recuento de carbohidratos o una aplicacin mvil para fijarse en el contenido de carbohidratos y el tamao de porcin de lo que desea comer.  Comience a Armed forces training and education officerconocer los tamaos de las porciones y a Public house managerreconocer cuntas porciones hay en una unidad. Esto le permitir calcular la cantidad de carbohidratos que puede comer. ALIMENTOS LIBRES Un "alimento libre" es cualquier alimento o bebida que contenga menos de 5g de carbohidratos por porcin. Entre los alimentos libres, se incluyen los siguientes:  Muchos vegetales.  Huevos duros.  Nueces o semillas.  Aceitunas.  Quesos.  Carnes. Estos tipos de alimentos son buenas opciones de bocadillos y en general estn disponibles en los bufs de ensaladas. Como aderezos "libres" para Talentensaladas, puede usarse jugo de limn, vinagre o un aderezo de bajas caloras (con menos de 20caloras por porcin).  OPCIONES PARA REDUCIR LOS CARBOHIDRATOS  Reemplace el yogur descremado endulzado por el yogur sin azcar. Tambin puede consumir yogur a base de Cortezleche de Berg[place]oja, pero es conveniente una opcin sin azcar o natural, porque tiene menos contenido de carbohidratos.  Pdale al mozo que retire la canasta de pan o las papas de la mesa.  Pida frutas frescas. El buf de ensaladas a menudo ofrece  frutas frescas. Evite las frutas enlatadas, ya que por lo general tienen azcar o almbar.  Pida una ensalada y cmala sin aderezo. Tambin puede crear un aderezo "libre" para ensaladas.  Pida que le [employer] Wholesale alimentos. Por ejemplo, en lugar de papas fritas, pida una porcin de vegetales, como una ensalada,  judas verdes o brcoli. OTROS CONSEJOS   Si [place] insulina, adminstrela una vez que la comida llegue a la mesa, as las Hotel manager.  Pregntele al mozo sobre el tamao de la porcin antes de pedir la comida y, si la porcin es ms grande de lo que usted debe consumir, pida una caja para llevarse la comida a su casa. Cuando llegue la comida, deje en el plato la cantidad que debe comer y coloque el resto en la caja para llevar.  Considere la posibilidad de Publishing rights manager un plato principal con alguien y de pedir una ensalada como guarnicin.   Esta informacin no tiene Marine scientist el consejo del mdico. Asegrese de hacerle al mdico cualquier pregunta que tenga.   Document Released: 01/30/2005 Document Revised: 10/21/2014 Elsevier Interactive Patient Education [employer].   -  Diabetes y actividad fsica (Diabetes and Exercise) Hacer actividad fsica con regularidad es muy importante. No se trata solo de [employer]. Tiene muchos otros beneficios, como por ejemplo:  Mejorar el estado fsico, la flexibilidad y la resistencia.  Aumenta la densidad sea.  Ayuda a Technical sales engineer.  Disminuye la Air traffic controller.  Aumenta la fuerza muscular.  Reduce el estrs y las tensiones.  Mejora el estado de salud general. Las personas diabticas que realizan actividad fsica tienen beneficios adicionales debido al ejercicio:  Reduce el apetito.  El organismo mejora el uso del azcar (glucosa) de la [place].  Ayuda a disminuir o Product/process development scientist.  Disminuye la presin arterial.  Ayuda a disminuir los lpidos en la sangre (colesterol y triglicridos).  El organismo mejora el uso de la insulina porque:  Aumenta la sensibilidad del organismo a la insulina.  Reduce las necesidades de insulina del organismo.  Disminuye el riesgo de enfermedad cardaca por la actividad fsica ya que  disminuye el colesterol y [employer]  triglicridos.  Aumenta los niveles de colesterol bueno (como las lipoprotenas de alta densidad [HDL]) en el organismo.  Disminuye los niveles de glucosa en la [place]. SU PLAN DE ACTIVIDAD  Elija una actividad que disfrute y establezca objetivos realistas. Para ejercitarse sin riesgos, debe comenzar a Psychologist, prison and probation services cualquier actividad fsica nueva lentamente y aumentar la intensidad del ejercicio de forma gradual con el tiempo. Su mdico o educador en diabetes podrn ayudarlo a crear un plan de actividades que lo beneficie. Las recomendaciones generales incluyen lo siguiente:  Psychologist, clinical a los nios para que realicen al menos 60 minutos de actividad fsica Armed forces operational officer.  Estirarse y Optometrist ejercicios de entrenamiento de la fuerza, como yoga o levantamiento de pesas, por lo menos 2 veces por semana.  Realizar en total por lo menos 150 minutos de ejercicios de intensidad moderada cada semana, como caminar a paso ligero o hacer gimnasia acutica.  Hacer ejercicio fsico por lo menos 3 das por semana y no dejar pasar ms de 2 das seguidos sin ejercitarse.  Evitar los perodos largos de inactividad (90 minutos o ms tiempo). Cuando deba pasar mucho tiempo sentado, haga pausas frecuentes para caminar o estirarse. RECOMENDACIONES PARA REALIZAR EJERCICIOS CUANDO SE TIENE DIABETES TIPO 1 O TIPO 2   Controle la glucosa en la sangre antes  de comenzar. Si el nivel de glucosa en la sangre es de ms de 240 mg/dl, controle las cetonas en la [place]ola. No haga actividad fsica si hay cetonas.  Evite inyectarse insulina en las zonas del cuerpo que ejercitar. Por ejemplo, evite inyectarse insulina en:  Los brazos, si juega al tenis.  Las piernas, si corre.  Lleve un registro de:  Los alimentos que consume antes y despus de Tour manager.  Los momentos esperables de picos de accin de la insulina.  Los niveles de glucosa en la sangre antes y despus de hacer ejercicios.  El tipo y cantidad de [place]  fsica que Biomedical engineer.  Revise los registros con su mdico. El mdico lo ayudar a Environmental education officer pautas para ajustar la cantidad de alimento y las cantidades de insulina antes y despus de Radio producer ejercicios.  Si toma insulina o agentes hipoglucemiantes por va oral, observe si hay signos y sntomas de hipoglucemia. Entre los que se incluyen:  Mareos.  Temblores.  Sudoracin.  Escalofros.  Confusin.  Beba gran cantidad de agua mientras hace ejercicios para evitar la deshidratacin o los golpes de Airline pilot. Durante la actividad fsica se pierde agua corporal que se debe reponer.  Comente con su mdico antes de comenzar un programa de actividad fsica para verificar que sea seguro para usted. Recuerde, cualquier actividad es mejor que ninguna.   Esta informacin no tiene Theme park manager el consejo del mdico. Asegrese de hacerle al mdico cualquier pregunta que tenga.   Document Released: 02/19/2007 Document Revised: 06/16/2014 Elsevier Interactive Patient Education 2016 [employer].   - Dislipidemia (Dyslipidemia) La dislipidemia es un desequilibrio de los lpidos de la [place]. Los lpidos son protenas blancas y cerosas que el organismo necesita en pequeas cantidades. La dislipidemia suele relacionarse con los lpidos, el colesterol o los triglicridos. Las formas frecuentes de dislipidemia son las siguientes:  Niveles elevados de colesterol malo (colesterol LDL), el tipo de colesterol que causa enfermedades cardacas.  Niveles bajos de colesterol bueno (colesterol HDL), el tipo de colesterol que ayuda a brindar proteccin contra las enfermedades cardacas.  Niveles altos de triglicridos, que son Neomia Dear sustancia grasa presente en la sangre que se relaciona con la acumulacin de placa en las arterias. FACTORES DE RIESGO  Edad avanzada.  Tener antecedentes familiares de colesterol elevado.  Ciertos medicamentos, entre ellos, anticonceptivos, diurticos, betabloqueantes y  algunos medicamentos para la depresin.  Fumar.  Comer una dieta rica en grasas.  Tener sobrepeso.  Enfermedades, como diabetes, sndrome de ovario poliqustico, Psychiatrist, enfermedades renales e hipotiroidismo.  La falta de [place] fsica regular. SIGNOS Y SNTOMAS La dislipidemia no presenta signos ni sntomas. DIAGNSTICO Se puede realizar un anlisis de sangre sencillo, llamado anlisis de sangre en [place], para Doctor, general practice de:  Colesterol total Es el valor combinado del colesterol LDL y el HDL. El valor saludable es inferior a200.  Colesterol LDL. El valor deseable de colesterol LDL es diferente para cada persona, en funcin de los factores de [place]. Consulte al mdico cul debe ser la cifra del colesterol LDL para usted.  Colesterol HDL. El nivel saludable de colesterol HDL es 60 o ms. Un valor por debajo de 40en los hombres o de 50en las mujeres es un signo de Orthoptist.  Triglicridos. El valor saludable de los triglicridos es de [place] [id number]. TRATAMIENTO La dislipidemia es una enfermedad tratable. El mdico siempre le aconsejar sobre el tipo de tratamiento ms adecuado en funcin de su edad, los [place] de los anlisis y las pautas actuales.  El tratamiento puede incluir lo siguiente:  Cambios en la dieta. Un nutricionista puede ayudarlo a crear [employer] se base en sus factores de riesgo, sus enfermedades y su estilo de vida.  Haga actividad fsica regularmente. Esto puede ayudar a Advertising account executive LDL, a aumentar el colesterol HDL y a Art gallery manager. Consulte al mdico antes de empezar un programa de ejercicios. La [employer]son de las personas deben hacer de actividad fsica enrgica, 5das por semana.  Dejar de fumar.  Medicamentos para bajar el colesterol LDL y los triglicridos.  Si los niveles de triglicridos estn elevados, el mdico puede hacer lo siguiente:  Indicarle que deje de beber alcohol.  Indicarle que restrinja la  ingesta de grasa.  Indicarle que elimine los azcares refinados de la dieta.  Tratarlo por otras afecciones, por ejemplo, tiroides hipoactiva (hipotiroidismo) y alta concentracin de azcar en la sangre (hiperglucemia). El Designer, television/film set los niveles de los lpidos con anlisis de sangre regulares. INSTRUCCIONES PARA EL CUIDADO EN EL HOGAR  Consuma una dieta saludable. Siga las indicaciones en cuanto a la dieta, en caso de que el mdico se las haya dado.  Mantenga un peso saludable.  Haga actividad fsica regularmente de acuerdo con las recomendaciones del mdico.  No consuma ningn producto que contenga tabaco, lo que incluye cigarrillos, tabaco de Theatre manager o Administrator, Civil Service.  Tome los medicamentos solamente como se lo haya indicado el mdico.  Concurra a todas las visitas de control como se lo haya indicado el mdico. SOLICITE ATENCIN MDICA SI: Los medicamentos le causan efectos secundarios.   Esta informacin no tiene Theme park manager el consejo del mdico. Asegrese de hacerle al mdico cualquier pregunta que tenga.   Document Released: 02/04/2013 Document Revised: 02/20/2014 Elsevier Interactive Patient Education 2016 [employer]. - Opciones de alimentos para bajar el nivel de triglicridos (Food Choices to Lower Your Triglycerides) Los triglicridos son un tipo de grasas que se Hotel manager. Un nivel elevado de triglicridos puede aumentar el riesgo de padecer enfermedades cardacas e infartos. Si sus niveles de triglicridos son altos, los alimentos que se ingieren y los hbitos de alimentacin son Engineer, production. Elegir los alimentos adecuados puede ayudar a Stage manager de triglicridos.  QU PAUTAS GENERALES DEBO SEGUIR?  Baje de peso si es necesario.  Limite o evite el alcohol.  Llene la mitad del plato con vegetales y ensaladas de hojas verdes.  Limite las frutas a dos porciones por da. Elija frutas en lugar de jugos.  Ocupe un  cuarto del plato con cereales integrales. Busque la palabra "integral" en Estate agent de la lista de ingredientes.  Llene un cuarto del plato con alimentos con protenas magras.  Disfrute de pescados grasos (como salmn, caballa, sardinas y atn) tres veces por semana.  Elija las grasas saludables.  Limite los alimentos con alto contenido de almidn y International aid/development worker.  Consuma ms comida casera y menos de restaurante, de buf y comida rpida.  Limite el consumo de alimentos fritos.  Cocine los alimentos utilizando mtodos que no sean la fritura.  Limite el consumo de grasas saturadas.  Verifique las listas de ingredientes para evitar alimentos con aceites parcialmente hidrogenados (grasas trans). QU ALIMENTOS PUEDO COMER?  Cereales Cereales integrales, como los panes de salvado o [place], las Bromide, los cereales y las pastas. Avena sin endulzar, trigo, [place], quinua o arroz integral. Tortillas de harina de maz o de salvado.  Vegetales Verduras frescas o congeladas (crudas, al vapor, asadas o  grilladas). Ensaladas de hojas verdes. Fruits Frutas frescas, en conserva (en su jugo natural) o frutas congeladas. Carnes y otros productos con protenas Carne de res molida (al 85% o ms [place]), carne de res de animales alimentados con pastos o carne de res sin la grasa. Pollo o pavo sin piel. Carne de pollo o de Ringo. Cerdo sin la grasa. Todos los pescados y frutos de mar. Huevos. Porotos, guisantes o lentejas secos. Frutos secos o semillas sin sal. Frijoles secos o en lata sin sal. Lcteos Productos lcteos con bajo contenido de grasas, como [place] o al 1%, quesos reducidos en grasas o al 2%, ricota con bajo contenido de grasas o [employer], o yogur natural con bajo contenido de [place]. Grasas y Writer en barra que no contengan grasas trans. [place] y condimentos para ensaladas livianos o reducidos en grasas. [place]. Aceites de crtamo, oliva o canola.  M[place]illa natural de man o almendra. Los artculos mencionados arriba pueden no ser Raytheon de las bebidas o los alimentos recomendados. Comunquese con el nutricionista para conocer ms opciones. QU ALIMENTOS NO SE RECOMIENDAN?  Cereales Pan blanco. Pastas blancas. Arroz blanco. Pan de maz. Bagels, pasteles y croissants. Galletas saladas que contengan grasas trans. Vegetales Papas blancas. Maz. Vegetales con crema o fritos. Verduras en salsa de [place]. Fruits Frutas secas. Fruta enlatada en almbar liviano o espeso. Jugo de frutas. Carnes y otros productos con protenas Cortes de carne con Holiday representative. Costillas, alas de pollo, tocineta, salchicha, mortadela, salame, chinchulines, tocino, perros calientes, salchichas alemanas y embutidos envasados. Lcteos Leche entera o al 2%, crema, mezcla de [place] y crema y queso crema. Yogur entero o endulzado. Quesos con toda su grasa. [place]as no lcteas y coberturas batidas. Quesos procesados, quesos para untar o cuajadas. Dulces y postres Jarabe de maz, azcares, miel y Radio broadcast assistant. Caramelos. Mermelada y [place]. Doreen Beam. Cereales endulzados. Galletas, pasteles, bizcochuelos, donas, muffins y helado. Grasas y [address], [place] en barra, [place] de [place], [place], [place] clarificada o grasa de tocino. Aceites de coco, de palmiste o de palma. Bebidas Alcohol. Bebidas endulzadas (como refrescos, limonadas y bebidas frutales o ponches). Los artculos mencionados arriba pueden no ser Raytheon de las bebidas y los alimentos que se Theatre stage manager. Comunquese con el nutricionista para recibir ms informacin.   Esta informacin no tiene Theme park manager el consejo del mdico. Asegrese de hacerle al mdico cualquier pregunta que tenga.   Document Released: 07/20/2009 Document Revised: 02/04/2013 Elsevier Interactive Patient Education 2016 [employer].  Influenza Virus Vaccine injection (Fluarix) Qu es este  medicamento? La VACUNA ANTIGRIPAL ayuda a disminuir el riesgo de contraer la influenza, tambin conocida como la gripe. La vacuna solo ayuda a protegerle contra algunas cepas de influenza. Esta vacuna no ayuda a reducir Nurse, adult de contraer influenza pandmica H1N1. Este medicamento puede ser utilizado para otros usos; si tiene alguna pregunta consulte con su proveedor de atencin mdica o con su farmacutico. Qu le debo informar a mi profesional de la salud antes de tomar este medicamento? Necesita saber si usted presenta alguno de los siguientes problemas o situaciones: -trastorno de sangrado como hemofilia -fiebre o infeccin -sndrome de Guillain-Barre u otros problemas neurolgicos -problemas del sistema inmunolgico -infeccin por el virus de la inmunodeficiencia humana (VIH) o SIDA -niveles bajos de plaquetas en la sangre -esclerosis mltiple -una Automotive engineer o inusual a las vacunas antigripales, a los huevos, protenas de pollo, al ltex, a la gentamicina, a otros medicamentos, alimentos, colorantes o conservantes -si  est embarazada o buscando quedar embarazada -si est amamantando a un beb Cmo debo utilizar este medicamento? Esta vacuna se administra mediante inyeccin por va intramuscular. Lo administra un profesional de [employer]. Recibir una copia de informacin escrita sobre la vacuna antes de cada vacuna. Asegrese de leer este folleto cada vez cuidadosamente. Este folleto puede cambiar con frecuencia. Hable con su pediatra para informarse acerca del uso de este medicamento en nios. Puede requerir atencin especial. Sobredosis: Pngase en contacto inmediatamente con un centro toxicolgico o una sala de urgencia si usted cree que haya tomado demasiado medicamento. ATENCIN: [employer] es solo para usted. No comparta este medicamento con nadie. Qu sucede si me olvido de una dosis? No se aplica en este caso. Qu puede interactuar con este  medicamento? -quimioterapia o radioterapia -medicamentos que suprimen el sistema inmunolgico, tales como etanercept, anakinra, infliximab y adalimumab -medicamentos que tratan o previenen cogulos sanguneos, como warfarina -fenitona -medicamentos esteroideos, como la prednisona o la cortisona -teofilina -vacunas Puede ser que esta lista no menciona todas las posibles interacciones. Informe a su profesional de [employer] de [employer] productos a base de hierbas, medicamentos de [place] o suplementos nutritivos que est tomando. Si usted fuma, consume bebidas alcohlicas o si utiliza drogas ilegales, indqueselo tambin a su profesional de [employer]. Algunas sustancias pueden interactuar con su medicamento. A qu debo estar atento al usar [employer]? Informe a su mdico o a Producer, television/film/video de la [employer] todos los efectos secundarios que persistan despus de [address]. Llame a su proveedor de atencin mdica si se presentan sntomas inusuales dentro de las 6 semanas posteriores a la vacunacin. Es posible que todava pueda contraer la gripe, pero la enfermedad no ser tan fuerte como normalmente. No puede contraer la gripe de esta vacuna. La vacuna antigripal no le protege contra resfros u otras enfermedades que pueden causar [place]. Debe vacunarse cada ao. Qu efectos secundarios puedo tener al [employer] este medicamento? Efectos secundarios que debe informar a su mdico o a Producer, television/film/video de la salud tan pronto como sea posible: -reacciones alrgicas como erupcin cutnea, picazn o urticarias, hinchazn de la cara, labios o lengua Efectos secundarios que, por lo general, no requieren atencin mdica (debe informarlos a su mdico o a su profesional de la salud si persisten o si son molestos): -fiebre -dolor de cabeza -molestias y dolores musculares -dolor, sensibilidad, enrojecimiento o Paramedic de la inyeccin -cansancio o debilidad Puede ser que esta lista no menciona  todos los posibles efectos secundarios. Comunquese a su mdico por asesoramiento mdico [employer]. Usted puede informar los efectos secundarios a la FDA por telfono al 1-800-FDA-1088. Dnde debo guardar mi medicina? Esta vacuna se administra solamente en clnicas, farmacias, consultorio mdico u otro consultorio de un profesional de la salud y no Teacher, early years/pre en su domicilio. ATENCIN: Este folleto es un resumen. Puede ser que no cubra toda la posible informacin. Si usted tiene preguntas acerca de esta medicina, consulte con su mdico, su farmacutico o su profesional de Radiographer, therapeutic.    2016, Elsevier/Gold Standard. (2009-08-03 15:31:40)   Madilyn Fireman antineumoccica de polisacridos: Lo que debe saber (Pneumococcal Polysaccharide Vaccine: What You Need to Know) 1. Por qu vacunarse? La vacunacin puede proteger a los [employer] (y a algunos nios y adultos ms jvenes) de la enfermedad neumoccica. La enfermedad neumoccica es causada por una bacteria que puede contagiarse de [place] persona a otra por contacto directo. Puede provocar infecciones en los odos  y tambin infecciones ms graves en:   los pulmones (neumona),  la sangre (bacteriemia), y  las membranas que cubren el cerebro y la mdula espinal (meningitis). La meningitis puede causar sordera y dao cerebral, y puede ser mortal. Cualquier persona puede contraer la enfermedad neumoccica, pero los nios menores de 2 aos, las personas con [employer], los adultos mayores de 65 aos y los fumadores son los que corren un mayor riesgo. Cada ao, mueren alrededor de [id number] adultos mayores a causa de la enfermedad [employer] [facility] [place]. El tratamiento de las infecciones neumoccicas con penicilina y otros medicamentos era ms efectivo en el pasado. Sin embargo, algunas cepas de la bacteria que causa la enfermedad se han vuelto resistentes a estos medicamentos. Esto hace que la prevencin  de la enfermedad mediante la vacunacin sea an ms importante. 2. Madilyn Fireman antineumoccica de polisacridos (PPSV23) La vacuna antineumoccica de polisacridos (PPSV23) protege contra 23tipos de bacterias neumoccicas. No previene todas las enfermedades neumoccicas. La vacuna PPSV23 se recomienda para las siguientes personas:  Todos los adultos de 65aos en adelante.  Teresita Madura persona de 2a 64aos que tenga un problema de salud a Air cabin crew.  [employer] de 2 a 64 aos que tengan el sistema inmunitario dbil.  Los adultos de 19 a 64 aos que fumen o tengan asma. La Harley[employer]son de las personas solo necesitan una dosis de la vacuna PPSV. Para ciertos grupos de alto riesgo se recomienda una segunda dosis. Las [employer] de 65 aos deben recibir una dosis de la vacuna, aun si recibieron una o ms dosis antes de [employer]. El mdico puede brindarle ms informacin sobre estas recomendaciones. La mayora de los adultos sanos adquiere proteccin 2 a 3semanas despus de haber recibido la vacuna. 3. Algunas personas no deben recibir la vacuna  Teresita Madura persona que haya sufrido una reaccin alrgica potencialmente mortal a la PPSV no debe recibir otra dosis.  Las personas que tengan Programmer, multimedia grave a los componentes de la vacuna PPSV no deben recibirla. Informe a su mdico si sufre de algn tipo de alergia grave.  Teresita Madura persona que est enferma en un grado moderado o grave el da en que est programada la vacunacin, probablemente deba esperar a recuperarse para recibirla. Por lo general, una persona con una enfermedad leve puede vacunarse.  Los nios menores de 2 aos no deben recibir [employer].  No hay pruebas de que la vacuna PPSV sea perjudicial para las mujeres embarazadas o el feto. No obstante, como precaucin, las mujeres que necesiten aplicarse la vacuna deben hacerlo antes de quedar embarazadas, si es posible. 4. Riesgos de [place] reaccin a la vacuna Con cualquier medicamento,  incluyendo las vacunas, existe la posibilidad de que aparezcan efectos secundarios. Estos son leves y desaparecen por s solos, pero tambin son posibles las reacciones graves. Aproximadamente la mitad de las personas que reciben la PPSV presentan efectos secundarios leves, como enrojecimiento o Art therapist donde se aplic la vacuna, que desaparecen a los [address]. Menos de 1 de cada 100personas presenta fiebre, dolores musculares o reacciones locales ms graves. Problemas que podran ocurrir despus de cualquier vacuna:  Las personas a veces se desmayan despus de un procedimiento mdico, incluso despus de recibir Scientist, forensic. Si permanece sentado o recostado durante 15 minutos puede ayudar a [employer] y las lesiones causadas por las cadas. Informe al mdico si se siente mareado, tiene cambios en la visin o zumbidos en los odos.  Algunas personas sienten un  dolor intenso en el hombro y tienen dificultad para mover el brazo donde se coloc la vacuna. Esto sucede con muy poca frecuencia.  Cualquier medicamento puede causar una reaccin alrgica grave. Dichas reacciones son Lynnae Sandhoff poco frecuentes con una vacuna (se calcula que menos de 1en un milln de dosis) y se producen unos minutos a unas horas despus de la vacunacin. Al igual que con cualquier [employer], existe una probabilidad remota de que una vacuna cause una lesin grave o la [place]. Se controla permanentemente la seguridad de las vacunas. Para obtener ms informacin, visite: http://floyd.org/. 5. Qu pasa si hay una reaccin grave? A qu signos debo estar atento? Observe todo lo que le preocupe, como signos de una reaccin alrgica grave, fiebre muy alta o comportamiento fuera de lo normal.  Los signos de una reaccin alrgica grave pueden incluir ronchas, hinchazn de la cara y la garganta, dificultad para respirar, latidos cardacos acelerados, mareos y debilidad. Generalmente, estos comenzaran entre unos  pocos minutos y algunas horas despus de la vacunacin. Qu debo hacer? Si usted piensa que se trata de una reaccin alrgica grave o de otra emergencia que no puede esperar, llame al 911 o dirjase al hospital ms cercano. Sino, llame a su mdico. Despus, la reaccin debe informarse al [address] de Informacin sobre Efectos Adversos de las [place] (Vaccine Adverse Event Reporting System, VAERS). Su mdico puede presentar este informe, o puede hacerlo usted mismo a travs del sitio web de VAERS, en www.vaers.LAgents.no, o llamando al [phone].  VAERS no brinda recomendaciones mdicas. 6. Cmo puedo obtener ms informacin?  Consulte a su mdico. Este puede darle el prospecto de la vacuna o recomendarle otras fuentes de informacin.  Comunquese con el servicio de salud de su localidad o [address].  Comunquese con los Centros para Air traffic controller y la Prevencin de Child psychotherapist for Disease Control and Prevention , CDC).  Llame al [phone] (1-800-CDC-INFO), o  Visite el sitio web de [employer] Corporation en PicCapture.uy. Declaracin de informacin sobre la vacuna antineumoccica de polisacridos de los CDC (06/06/13)   Esta informacin no tiene Theme park manager el consejo del mdico. Asegrese de hacerle al mdico cualquier pregunta que tenga.   Document Released: 04/28/2008 Document Revised: 02/20/2014 Elsevier Interactive Patient Education [employer]

## 2015-10-13 NOTE — Progress Notes (Signed)
Pt is in the office today for establish Pt states she is not having any pain today Pt states she is taking her medication without difficulties

## 2015-10-13 NOTE — Progress Notes (Signed)
Kristin Wall, is a 35 y.o. female  [id number]  [id number]  DOB - 1980/09/05  CC:  Chief Complaint  Patient presents with  . Establish Care       HPI: Kristin Wall is a 35 y.o. female here today to establish medical care, with hx of dm, hld, hypothyrodism. Per pt, eating less carbs, exercising w/ walking 10mns or so at park 2-3 x wk. She states she typically only eats 2x day these days.  Does not smoke, no tob.  Never had pneumovax or eye exam. Per pt, last papsmear she thinks was 2 years ago, different clinic. No problems w/ medications. Taking all.  Patient has No headache, No chest pain, No abdominal pain - No Nausea, No new weakness tingling or numbness, No Cough - SOB.    Review of Systems: Per HPI, o/w all systems reviewed and negative.   No Known Allergies Past Medical History:  Diagnosis Date  . Amenorrhea    Current Outpatient Prescriptions on File Prior to Visit  Medication Sig Dispense Refill  . atorvastatin (LIPITOR) 10 MG tablet Take 1 tablet (10 mg total) by mouth daily. 90 tablet 3  . glucose monitoring kit (FREESTYLE) monitoring kit 1 each by Does not apply route as needed for other. Dispense any model that is covered- dispense testing supplies for Q AC/ HS accuchecks- 1 month supply with one refil. 1 each 1  . levothyroxine (SYNTHROID, LEVOTHROID) 50 MCG tablet Take 1 tablet (50 mcg total) by mouth daily before breakfast. 30 tablet 2  . metFORMIN (GLUCOPHAGE) 500 MG tablet Take 1 tablet (500 mg total) by mouth 2 (two) times daily with a meal. 180 tablet 3  . medroxyPROGESTERone (PROVERA) 10 MG tablet Take 1 tablet (10 mg total) by mouth daily. (Patient not taking: Reported on 10/13/2015) 10 tablet 3   No current facility-administered medications on file prior to visit.    Family History  Problem Relation Age of Onset  . Diabetes Father   . Cancer Paternal Grandmother    Social History   Social History  . Marital status:  Single    Spouse name: N/A  . Number of children: N/A  . Years of education: N/A   Occupational History  . Not on file.   Social History Main Topics  . Smoking status: Never Smoker  . Smokeless tobacco: Not on file  . Alcohol use Yes     Comment: occasionally  . Drug use: No  . Sexual activity: Yes    Birth control/ protection: None   Other Topics Concern  . Not on file   Social History Narrative  . No narrative on file    Objective:   Vitals:   10/13/15 0948  BP: 132/90  Pulse: 69  Resp: 16  Temp: 98.6 F (37 C)    Filed Weights   10/13/15 0948  Weight: 228 lb 9.6 oz (103.7 kg)    BP Readings from Last 3 Encounters:  10/13/15 132/90  04/21/15 130/82  12/18/14 139/85    Physical Exam: Constitutional: Patient appears well-developed and well-nourished. No distress. AAOx3, pleasant. Obese. HENT: Normocephalic, atraumatic, External right and left ear normal. Oropharynx is clear and moist.  Eyes: Conjunctivae and EOM are normal. PERRL, no scleral icterus. Neck: Normal ROM. Neck supple. No JVD.  CVS: RRR, S1/S2 +, no murmurs, no gallops, no carotid bruit.  Pulmonary: Effort and breath sounds normal, no stridor, rhonchi, wheezes, rales.  Abdominal: Soft. BS +, no distension, tenderness, rebound or guarding.  Musculoskeletal: Normal range of motion. No edema and no tenderness.  LE: bilat/ no c/c/e, pulses 2+ bilateral Foot exam: bilateral peripheral pulses 2+ (dorsalis pedis and post tibialis pulses), no ulcers noted/no ecchymosis, warm to touch, Sensation intact.  No c/c/e.Marland Kitchen Neuro: Alert. muscle tone coordination wnl. No cranial nerve deficit grossly. Skin: Skin is warm and dry. No rash noted. Not diaphoretic. No erythema. No pallor. Psychiatric: Normal mood and affect. Behavior, judgment, thought content normal.  Lab Results  Component Value Date   WBC 6.5 10/14/2013   HGB 14.6 10/14/2013   HCT 42.0 10/14/2013   MCV 80.5 10/14/2013   PLT 289 10/14/2013    Lab Results  Component Value Date   CREATININE 0.60 04/21/2015   BUN 10 04/21/2015   NA 137 04/21/2015   K 4.3 04/21/2015   CL 101 04/21/2015   CO2 28 04/21/2015    Lab Results  Component Value Date   HGBA1C 6.9 10/13/2015   Lipid Panel     Component Value Date/Time   CHOL 204 (H) 12/18/2014 1016   TRIG 143 12/18/2014 1016   HDL 40 (L) 12/18/2014 1016   CHOLHDL 5.1 (H) 12/18/2014 1016   VLDL 29 12/18/2014 1016   LDLCALC 135 (H) 12/18/2014 1016       Depression screen PHQ 2/9 10/13/2015  Decreased Interest 0  Down, Depressed, Hopeless 0  PHQ - 2 Score 0    Assessment and plan:   1. Hypothyroidism, unspecified hypothyroidism type Will renew based on labs today. - TSH  2. Diabetes mellitus due to underlying condition with hyperglycemia, without long-term current use of insulin (HCC) - continue low carb diet, increase exercise - continue metformin 500bid - Glucose (CBG) - HgB A1c 6.9 today - Ambulatory referral to Ophthalmology - CBC with Differential  3. Dyslipidemia Currently fasting, continue atorvastatin 10 qhs - Lipid Panel  4. Flu vaccine need - Flu Vaccine QUAD 36+ mos IM (Fluarix & Fluzone Quad PF  5. Encounter for screening for HIV - HIV antibody (with reflex)  6. pneumovac 23v today.  7. Papsmear, pt states due, thinks done 2 years ago, but not certain.  Return in about 3 weeks (around 11/03/2015) for pap smear.  The patient was given clear instructions to go to ER or return to medical center if symptoms don't improve, worsen or new problems develop. The patient verbalized understanding. The patient was told to call to get lab results if they Kristin't heard anything in the next week.    This note has been created with Surveyor, quantity. Any transcriptional errors are unintentional.   Maren Reamer, MD, [place], [place]    10/13/2015, 10:40 AM

## 2015-10-15 ENCOUNTER — Other Ambulatory Visit: Payer: Self-pay | Admitting: Internal Medicine

## 2015-10-15 ENCOUNTER — Telehealth: Payer: Self-pay

## 2015-10-15 ENCOUNTER — Encounter: Payer: Self-pay | Admitting: Internal Medicine

## 2015-10-15 DIAGNOSIS — E785 Hyperlipidemia, unspecified: Secondary | ICD-10-CM

## 2015-10-15 MED ORDER — LEVOTHYROXINE SODIUM 50 MCG PO TABS: 50.0000 ug | ORAL_TABLET | Freq: Every day | ORAL | 2 refills | Status: DC

## 2015-10-15 MED ORDER — ATORVASTATIN CALCIUM 20 MG PO TABS: 20.0000 mg | ORAL_TABLET | Freq: Every day | ORAL | 2 refills | Status: DC

## 2015-10-15 NOTE — Telephone Encounter (Signed)
Unsuccessful call attempt to patient message states "voicemail is not set-up".

## 2015-10-22 ENCOUNTER — Ambulatory Visit: Payer: Self-pay | Attending: Internal Medicine

## 2015-10-27 ENCOUNTER — Telehealth: Payer: Self-pay

## 2015-10-27 NOTE — Telephone Encounter (Signed)
[employer] BrahamRosa Id: [id number] contacted pt to go over lab results pt did not answer was unable to lvm will try again another day/time

## 2015-10-29 ENCOUNTER — Telehealth: Payer: Self-pay

## 2015-10-29 NOTE — Telephone Encounter (Signed)
[employer] Kelby FamManuel Id: [id number] Contacted pt to go over lab results pt didn't answer was unable to lvm will be mailing letter out today

## 2016-01-19 ENCOUNTER — Encounter: Payer: Self-pay | Admitting: Internal Medicine

## 2016-01-19 ENCOUNTER — Ambulatory Visit: Payer: Self-pay | Attending: Internal Medicine | Admitting: Internal Medicine

## 2016-01-19 VITALS — BP 151/88 | HR 68 | Temp 98.0°F | Resp 16 | Wt 227.7 lb

## 2016-01-19 DIAGNOSIS — E1165 Type 2 diabetes mellitus with hyperglycemia: Secondary | ICD-10-CM | POA: Insufficient documentation

## 2016-01-19 DIAGNOSIS — N912 Amenorrhea, unspecified: Secondary | ICD-10-CM | POA: Insufficient documentation

## 2016-01-19 DIAGNOSIS — Z79899 Other long term (current) drug therapy: Secondary | ICD-10-CM | POA: Insufficient documentation

## 2016-01-19 DIAGNOSIS — I10 Essential (primary) hypertension: Secondary | ICD-10-CM | POA: Insufficient documentation

## 2016-01-19 DIAGNOSIS — E0865 Diabetes mellitus due to underlying condition with hyperglycemia: Secondary | ICD-10-CM

## 2016-01-19 DIAGNOSIS — E785 Hyperlipidemia, unspecified: Secondary | ICD-10-CM | POA: Insufficient documentation

## 2016-01-19 DIAGNOSIS — Z7984 Long term (current) use of oral hypoglycemic drugs: Secondary | ICD-10-CM | POA: Insufficient documentation

## 2016-01-19 DIAGNOSIS — E119 Type 2 diabetes mellitus without complications: Secondary | ICD-10-CM

## 2016-01-19 DIAGNOSIS — E039 Hypothyroidism, unspecified: Secondary | ICD-10-CM | POA: Insufficient documentation

## 2016-01-19 LAB — GLUCOSE, POCT (MANUAL RESULT ENTRY): POC GLUCOSE: 133 mg/dL — AB (ref 70–99)

## 2016-01-19 LAB — TSH: TSH: 2 mIU/L

## 2016-01-19 LAB — T4, FREE: Free T4: 1.6 ng/dL (ref 0.8–1.8)

## 2016-01-19 LAB — POCT GLYCOSYLATED HEMOGLOBIN (HGB A1C): Hemoglobin A1C: 7.9

## 2016-01-19 MED ORDER — GLYBURIDE 5 MG PO TABS: 5.0000 mg | ORAL_TABLET | Freq: Every day | ORAL | 3 refills | Status: DC

## 2016-01-19 MED ORDER — METFORMIN HCL 1000 MG PO TABS: 1000.0000 mg | ORAL_TABLET | Freq: Two times a day (BID) | ORAL | 3 refills | Status: DC

## 2016-01-19 MED ORDER — ATORVASTATIN CALCIUM 20 MG PO TABS: 20.0000 mg | ORAL_TABLET | Freq: Every day | ORAL | 2 refills | Status: DC

## 2016-01-19 MED FILL — ATORVASTATIN 20 MG TABLET: 20 | 30 days supply | Qty: 30 | Fill #0

## 2016-01-19 MED FILL — metFORMIN HCL 1000 MG TABS: 1000 | 30 days supply | Qty: 60 | Fill #0

## 2016-01-19 MED FILL — glyBURIDE 5 MG TABS: 5 | 30 days supply | Qty: 30 | Fill #0

## 2016-01-19 NOTE — Progress Notes (Signed)
Kristin Wall, is a 35 y.o. female  [id number]  [id number]  DOB - Oct 14, 1980  Chief Complaint  Patient presents with  . Follow-up        Subjective:   Kristin Wall is a 35 y.o. female here today for a follow up visit, last seen 8/30, w/ signif pmhx of dm, htn, hypothyroidism, morbid obesity. She states she has been taking all her meds w/o difficulty and trying to eat smaller portion sizes.  Denies tob/etoh.   Patient has No headache, No chest pain, No abdominal pain - No Nausea, No new weakness tingling or numbness, No Cough - SOB.  No problems updated.  ALLERGIES: No Known Allergies  PAST MEDICAL HISTORY: Past Medical History:  Diagnosis Date  . Amenorrhea     MEDICATIONS AT HOME: Prior to Admission medications   Medication Sig Start Date End Date Taking? Authorizing Provider  atorvastatin (LIPITOR) 20 MG tablet Take 1 tablet (20 mg total) by mouth daily. 01/19/16   Maren Reamer, MD  glucose monitoring kit (FREESTYLE) monitoring kit 1 each by Does not apply route as needed for other. Dispense any model that is covered- dispense testing supplies for Q AC/ HS accuchecks- 1 month supply with one refil. 01/02/14   Lorayne Marek, MD  glyBURIDE (DIABETA) 5 MG tablet Take 1 tablet (5 mg total) by mouth daily with breakfast. 01/19/16   Maren Reamer, MD  levothyroxine (SYNTHROID, LEVOTHROID) 50 MCG tablet Take 1 tablet (50 mcg total) by mouth daily before breakfast. 10/15/15   Maren Reamer, MD  medroxyPROGESTERone (PROVERA) 10 MG tablet Take 1 tablet (10 mg total) by mouth daily. Patient not taking: Reported on 10/13/2015 05/21/13   Donnamae Jude, MD  metFORMIN (GLUCOPHAGE) 1000 MG tablet Take 1 tablet (1,000 mg total) by mouth 2 (two) times daily with a meal. 01/19/16   Maren Reamer, MD     Objective:   There were no vitals filed for this visit. 151/88 p68, 96%, repeat 120/90   Exam General appearance : Awake, alert, not in any distress.  Speech Clear. Not toxic looking, morbid obesity  HEENT: Atraumatic and Normocephalic, pupils equally reactive to light. Neck: supple, no JVD.  Chest:Good air entry bilaterally, no added sounds. CVS: S1 S2 regular, no murmurs/gallups or rubs. Abdomen: Bowel sounds active, obese, Non tender and not distended with no gaurding, rigidity or rebound. Foot exam: bilateral peripheral pulses 2+ (dorsalis pedis and post tibialis pulses), no ulcers noted/no ecchymosis, warm to touch, monofilament testing 3/3 bilat. Sensation intact.  No c/c/e. Neurology: Awake alert, and oriented X 3, CN II-XII grossly intact, Non focal Skin:No Rash  Data Review Lab Results  Component Value Date   HGBA1C 7.9 01/19/2016   HGBA1C 6.9 10/13/2015   HGBA1C 7.3 04/21/2015    Depression screen PHQ 2/9 01/19/2016 10/13/2015  Decreased Interest 0 0  Down, Depressed, Hopeless 0 0  PHQ - 2 Score 0 0      Assessment & Plan   1. Diabetes mellitus due to underlying condition with hyperglycemia, without long-term current use of insulin (HCC) - reed on low carb diet, increase exercise, pt only intermittently walking currently. - info provided - increase metformin to 1000bid - Microalbumin/Creatinine Ratio, Urine - POCT glucose (manual entry) 133 - POCT glycosylated hemoglobin (Hb A1C) 7.9 - glyburide 5 qd added - - metFORMIN (GLUCOPHAGE) 1000 MG tablet; Take 1 tablet (1,000 mg total) by mouth 2 (two) times daily with a meal.  Dispense: 180 tablet; Refill: 3  2. Hypothyroidism, unspecified type Will rechk levels prior to renewing synthroid - TSH - T4, Free  3. Dyslipidemia - atorvastatin (LIPITOR) 20 MG tablet; Take 1 tablet (20 mg total) by mouth daily.  Dispense: 90 tablet; Refill: 2  4. Morbid obesity ([place]) Weight loss recd/discussed, low carb diet/increase exercise/small portions will help  5. uptodate on vaccines Needs pap  6. Elevated bp initially Repeat normal, advised low salt diet for now, info  provided    Patient have been counseled extensively about nutrition and exercise  Return in about 4 weeks (around 02/16/2016) for pap.  The patient was given clear instructions to go to ER or return to medical center if symptoms don't improve, worsen or new problems develop. The patient verbalized understanding. The patient was told to call to get lab results if they haven't heard anything in the next week.   This note has been created with Surveyor, quantity. Any transcriptional errors are unintentional.   Maren Reamer, MD, [place] [facility] [place], [place]   01/19/2016, 12:35 PM

## 2016-01-19 NOTE — Patient Instructions (Addendum)
La diabetes mellitus y los alimentos (Diabetes Mellitus and Food) Es importante que controle su nivel de azcar en la sangre (glucosa). El nivel de glucosa en sangre depende en gran medida de lo que usted come. Comer alimentos saludables en las cantidades [place] a lo largo del Training and development officer, aproximadamente a la misma hora [employer], lo ayudar a Chief Technology Officer su nivel de Multimedia programmer. Tambin puede ayudarlo a retrasar o Patent attorney de la diabetes mellitus. Comer de [employer] saludable incluso puede ayudarlo a Chartered loss adjuster de presin arterial y a Science writer o Theatre manager un peso saludable. Entre las recomendaciones generales para alimentarse y Audiological scientist los alimentos de forma saludable, se incluyen las siguientes:  Respetar las comidas principales y comer colaciones con regularidad. Evitar pasar largos perodos sin comer con el fin de perder peso.  Seguir una dieta que consista principalmente en alimentos de origen vegetal, como frutas, vegetales, frutos secos, legumbres y cereales integrales.  Utilizar mtodos de coccin a baja temperatura, como hornear, en lugar de mtodos de coccin a alta temperatura, como frer en abundante aceite. Trabaje con el nutricionista para aprender a Financial planner nutricional de las etiquetas de los alimentos. CMO PUEDEN AFECTARME LOS ALIMENTOS? Carbohidratos  Los carbohidratos afectan el nivel de glucosa en sangre ms que cualquier otro tipo de alimento. El nutricionista lo ayudar a Teacher, adult education cuntos carbohidratos puede consumir en cada comida y ensearle a contarlos. El recuento de carbohidratos es importante para mantener la glucosa en sangre en un nivel saludable, en especial si utiliza insulina o toma determinados medicamentos para la diabetes mellitus. Alcohol  El alcohol puede provocar disminuciones sbitas de la glucosa en sangre (hipoglucemia), en especial si utiliza insulina o toma determinados medicamentos para la diabetes mellitus. La  hipoglucemia es una afeccin que puede poner en peligro la vida. Los sntomas de la hipoglucemia (somnolencia, mareos y Data processing manager) son similares a los sntomas de haber consumido mucho alcohol. Si el mdico lo autoriza a beber alcohol, hgalo con moderacin y siga estas pautas:  Las mujeres no deben beber ms de un trago por da, y los hombres no deben beber ms de dos tragos por Training and development officer. Un trago es igual a:  12 onzas (355 ml) de cerveza  5 onzas de vino (150 ml) de vino  1,5onzas (27m) de bebidas espirituosas  No beba con el estmago vaco.  Mantngase hidratado. Beba agua, gaseosas dietticas o t helado sin azcar.  Las gaseosas comunes, los jugos y otros refrescos podran contener muchos carbohidratos y se dCivil Service fast streamer QU ALIMENTOS NO SE RECOMIENDAN? Cuando haga las elecciones de alimentos, es importante que recuerde que todos los alimentos son distintos. Algunos tienen menos nutrientes que otros por porcin, aunque podran tener la misma cantidad de caloras o carbohidratos. Es difcil darle al cuerpo lo que necesita cuando consume alimentos con menos nutrientes. Estos son algunos ejemplos de alimentos que debera evitar ya que contienen muchas caloras y carbohidratos, pero pocos nutrientes:  GPhysicist, medicaltrans (la mayora de los alimentos procesados incluyen grasas trans en la etiqueta de Informacin nutricional).  Gaseosas comunes.  Jugos.  Caramelos.  Dulces, como tortas, pasteles, rosquillas y gHuxley  Comidas fritas. QU ALIMENTOS PUEDO COMER? Consuma alimentos ricos en nutrientes, que nutrirn el cuerpo y lo mantendrn saludable. Los alimentos que debe comer tambin dependern de varios factores, como:  Las caloras que necesita.  Los medicamentos que toma.  Su peso.  El nivel de glucosa en sSlatington  El nJacksonde presin arterial.  El nivel de colesterol.  Debe consumir una amplia variedad de alimentos, por ejemplo:  Protenas.  Cortes de [employer].  Protenas con bajo contenido de grasas saturadas, como pescado, clara de huevo y frijoles. Evite las carnes procesadas.  Frutas y vegetales.  Frutas y Sports administrator que pueden ayudar a Chief Operating Officer los niveles sanguneos de [place], como [place], mangos y batatas.  Productos lcteos.  Elija productos lcteos sin grasa o con bajo contenido de [place], como [place], yogur y [place].  Cereales, panes, pastas y arroz.  Elija cereales integrales, como panes multicereales, avena en grano y arroz integral. Estos alimentos pueden ayudar a controlar la presin arterial.  Rosalin Hawking.  Alimentos que contengan grasas saludables, como frutos secos, Chartered certified accountant, aceite de [place], aceite de canola y pescado. TODOS LOS QUE PADECEN DIABETES MELLITUS TIENEN EL MISMO PLAN DE COMIDAS? Dado que todas las personas que padecen diabetes mellitus son distintas, no hay un solo plan de comidas que funcione para todos. Es muy importante que se rena con un nutricionista que lo ayudar a crear un plan de comidas adecuado para usted. Esta informacin no tiene Theme park manager el consejo del mdico. Asegrese de hacerle al mdico cualquier pregunta que tenga. Document Released: 05/09/2007 Document Revised: 02/20/2014 Document Reviewed: 12/27/2012 Elsevier Interactive Patient Education  2017 [employer].   -  Consejos para comer fuera de su casa si tiene diabetes (Tips for Eating Away From Home If You Have Diabetes) El control del nivel de glucemia, que tambin se conoce como azcar en la [place], puede ser un reto, que se complica an ms cuando uno no prepara sus propias comidas. Los siguientes consejos pueden ayudarlo a Chief Operating Officer la diabetes cuando come fuera de su casa. PLANIFICACIN Organcese si sabe que comer fuera de su casa:  Pregntele al mdico cmo sincronizar las comidas y el medicamento si est en tratamiento con insulina.  Haga una lista de restaurantes cercanos que ofrezcan opciones saludables. Si  tienen un men que pueda leer en su casa, llvelo y planifique lo que pedir con anticipacin.  Busque informacin en lnea del restaurante donde quiera comer. Muchos restaurantes de comida rpida y cadenas de restaurantes incluyen la informacin nutricional en lnea. Tenga en cuenta esta informacin para elegir las opciones ms saludables y calcular los carbohidratos de la comida.  Use un libro de recuento de carbohidratos o una aplicacin mvil para fijarse en el contenido de carbohidratos y el tamao de porcin de lo que desea comer.  Comience a Armed forces training and education officer de las porciones y a Public house manager cuntas porciones hay en una unidad. Esto le permitir calcular la cantidad de carbohidratos que puede comer. ALIMENTOS LIBRES Un "alimento libre" es cualquier alimento o bebida que contenga menos de 5g de carbohidratos por porcin. Entre los alimentos libres, se incluyen los siguientes:  Muchos vegetales.  Huevos duros.  Nueces o semillas.  Aceitunas.  Quesos.  Carnes. Estos tipos de alimentos son buenas opciones de bocadillos y en general estn disponibles en los bufs de ensaladas. Como aderezos "libres" para [place], puede usarse jugo de limn, vinagre o un aderezo de bajas caloras (con menos de 20caloras por porcin). OPCIONES PARA REDUCIR LOS CARBOHIDRATOS  Reemplace el yogur descremado endulzado por el yogur sin azcar. Tambin puede consumir yogur a base de [place], pero es conveniente una opcin sin azcar o natural, porque tiene menos contenido de carbohidratos.  Pdale al mozo que retire la canasta de pan o las papas de la mesa.  Pida frutas frescas. El buf de ensaladas a menudo ofrece  frutas frescas. Evite las frutas enlatadas, ya que por lo general tienen azcar o almbar.  Pida una ensalada y cmala sin aderezo. Tambin puede crear un aderezo "libre" para ensaladas.  Pida que le [employer] alimentos. Por ejemplo, en lugar de papas fritas, pida una porcin de  vegetales, como una ensalada, judas verdes o brcoli. OTROS CONSEJOS  Si [place] insulina, adminstrela una vez que la comida llegue a la mesa, as las Automotive engineer.  Pregntele al mozo sobre el tamao de la porcin antes de pedir la comida y, si la porcin es ms grande de lo que usted debe consumir, pida una caja para llevarse la comida a su casa. Cuando llegue la comida, deje en el plato la cantidad que debe comer y coloque el resto en la caja para llevar.  Considere la posibilidad de Agricultural consultant un plato principal con alguien y de pedir una ensalada como guarnicin. Esta informacin no tiene Theme park manager el consejo del mdico. Asegrese de hacerle al mdico cualquier pregunta que tenga. Document Released: 01/30/2005 Document Revised: 05/24/2015 Document Reviewed: 04/29/2013 Elsevier Interactive Patient Education  2017 Elsevier Inc.  -   Diabetes mellitus y actividad fsica (Diabetes Mellitus and Exercise) Hacer actividad fsica habitualmente es importante para el estado de salud general, en especial si tiene diabetes (diabetes mellitus). La actividad fsica no solo se reduce a Psychiatric nurse. Aporta muchos beneficios para la salud, como aumentar la fuerza muscular y la densidad sea, y reducir las grasas corporales y Development worker, community. Esto mejora el estado fsico, la flexibilidad y la resistencia, y todo ello redunda en un mejor estado de salud general. La actividad fsica tiene beneficios adicionales para los diabticos, entre ellos:  Disminuye el apetito.  Ayuda a bajar y Photographer glucemia bajo control.  Baja la presin arterial.  Ayuda a controlar las cantidades de sustancias grasas (lpidos) en la [place], como el colesterol y los triglicridos.  Mejora la respuesta del cuerpo a la insulina (optimizacin de la sensibilidad a la insulina).  Reduce la cantidad de insulina que el cuerpo necesita.  Reduce el riesgo de sufrir cardiopata coronaria de la siguiente  forma:  Campbell Soup de colesterol y triglicridos.  Aumenta los niveles de colesterol bueno.  Disminuye la glucemia. QU ES EL PLAN DE ACTIVIDADES? El mdico o un educador para la diabetes certificado pueden ayudarlo a Teacher, English as a foreign language del tipo y de la frecuencia de actividad fsica (plan de actividades) adecuado para usted. Asegrese de lo siguiente:  Haga por lo menos semanales de ejercicios de intensidad moderada o vigorosa. Estos podran ser caminatas dinmicas, ciclismo o [place].  Haga ejercicios de elongacin y de fortalecimiento, como yoga o levantamiento de pesas, por lo menos 2veces por semana.  Reparta la actividad en al menos 3das de la semana.  Haga algn tipo de actividad fsica [employer].  No deje pasar ms de 2das seguidos sin hacer algn tipo de actividad fsica.  No permanezca inactivo durante ms de seguidos. Tmese descansos frecuentes para caminar o estirarse.  Elija un tipo de ejercicio o de actividad que disfrute y establezca objetivos realistas.  Comience lentamente y aumente de [place] gradual la intensidad del ejercicio con el correr del [place]. QU DEBO SABER ACERCA DEL CONTROL DE LA DIABETES?  Contrlese la glucemia antes y despus de ejercitarse.  Si la glucemia supera los 240mg /dl (09,[id number]) antes de ejercitarse, hgase un control de la orina para detectar la presencia de cetonas. Si tiene cetonas en  la orina, no se ejercite hasta que la glucemia se normalice.  Conozca los sntomas de la glucemia baja (hipoglucemia) y aprenda cmo tratarla. El riesgo de tener hipoglucemia Lesothoaumenta durante y despus de hacer actividad fsica. Los sntomas frecuentes de hipoglucemia pueden incluir los siguientes:  AlexHambre.  Ansiedad.  Sudoracin y piel hmeda.  Confusin.  Mareos o sensacin de desvanecimiento.  Aumento de la frecuencia cardaca o palpitaciones.  Visin borrosa.  Hormigueo o  adormecimiento alrededor Public Service Enterprise Groupde la boca, los labios o la Willoughbylengua.  Temblores o sacudidas.  Irritabilidad.  Tenga una colacin de carbohidratos de accin rpida disponible antes, durante y despus de ejercitarse, a fin de evitar o tratar la hipoglucemia.  Evite inyectarse insulina en las zonas del cuerpo que ejercitar. Por ejemplo, evite inyectarse insulina en:  Los brazos, si juega al tenis.  Las piernas, si corre.  Lleve registros de sus hbitos de actividad fsica. Esto puede ayudarlos a usted y al mdico a Retail bankeradaptar el plan de control de la diabetes segn sea necesario. Escriba los siguientes datos:  Los alimentos que consume antes y despus de Radio producerhacer actividad fsica.  Los niveles de glucosa en la sangre antes y despus de hacer ejercicios.  El tipo y cantidad de [place] and the Grenadinesactividad fsica que Biomedical engineerrealiza.  Cuando se prev que la insulina alcance su valor mximo, si B[place]ausa insulina. No haga actividad fsica en los momentos en que insulina alcanza su valor mximo.  Cuando comience un ejercicio o una actividad nuevos, trabaje con el mdico para asegurarse de que la actividad sea segura para usted y para Academic librarianajustar la [place] Fallsinsulina, los medicamentos o la ingesta de alimentos segn sea necesario.  Beba gran cantidad de agua mientras hace ejercicios para evitar la deshidratacin o los golpes de Airline pilotcalor. Beba suficiente lquido para Photographermantener la orina clara o de color amarillo plido. Esta informacin no tiene Theme park managercomo fin reemplazar el consejo del mdico. Asegrese de hacerle al mdico cualquier pregunta que tenga. Document Released: 02/19/2007 Document Revised: 05/24/2015 Document Reviewed: 07/12/2015 Elsevier Interactive Patient Education  2017 Elsevier Inc.  -   Plan de alimentacin con bajo contenido de sodio (Low-Sodium Eating Plan) El sodio aumenta la presin arterial y hace que el cuerpo retenga lquidos. El consumo de alimentos con menos sodio ayuda a Conservator, museum/gallerydisminuir la presin arterial, a Building services engineerreducir la hinchazn y a  Physicist, medicalproteger el corazn, el hgado y los riones. Agregar sal (cloruro de sodio) a los alimentos aumenta el aporte de Kingstonsodio. La mayor parte del sodio proviene de los alimentos enlatados, envasados y congelados. La pizza, la comida rpida y la comida de los restaurantes tambin contienen mucho sodio. Aunque usted tome medicamentos para bajar la presin arterial o reducir el lquido del cuerpo, es importante que disminuya el aporte de sodio de los alimentos. EN QU CONSISTE EL PLAN? La Harley-Davidsonmayora de las personas deberan limitar la ingesta de sodio a 2300mg  por Futures traderda. El mdico le recomienda que limite su consumo de sodio a 2,000 mg por da. QU DEBO SABER ACERCA DE ESTE PLAN DE ALIMENTACIN? Para el plan de alimentacin con bajo contenido de sodio, debe seguir estas pautas generales:  Elija alimentos con un valor porcentual diario de sodio de menos del 5% (segn se indica en la etiqueta).  Use hierbas o aderezos sin sal, en lugar de sal de mesa o sal marina.  Consulte al mdico o farmacutico antes de usar sustitutos de la sal.  Coma alimentos frescos.  Coma ms frutas y verduras.  Limite las verduras enlatadas. Si las consume, enjuguelas  bien para disminuir el sodio.  Limite el consumo de queso a 1onza (28g) por Futures traderda.  Coma productos con bajo contenido de sodio, cuya etiqueta suele decir "bajo contenido de sodio" o "sin agregado de sal".  Evite alimentos que contengan glutamato monosdico (MSG), que a veces se agrega a la comida Armeniachina y a algunos alimentos enlatados.  Consulte las etiquetas de los alimentos (etiquetas de informacin nutricional) para saber cunto sodio contiene una porcin.  Consuma ms comida casera y menos de restaurante, de buf y comida rpida.  Cuando coma en un restaurante, pida que preparen su comida con menos sal o, en lo posible, sin nada de sal. CMO LEO LA INFORMACIN SOBRE EL SODIO EN LAS ETIQUETAS DE LOS ALIMENTOS? La etiqueta de informacin nutricional  indica la cantidad de sodio en una porcin de alimento. Si come ms de una porcin, debe multiplicar la cantidad indicada de sodio por la cantidad de porciones. Las etiquetas de los alimentos tambin pueden indicar lo siguiente:  Sin sodio: menos de 5mg  por porcin.  Cantidad muy baja de sodio: 35mg  o menos por porcin.  Cantidad baja de sodio: 140mg  o menos por porcin.  Menor cantidad de sodio: 50% menos de sodio en una porcin. Por ejemplo, si un alimento generalmente contiene 300 mg de sodio se modifica para ser Edison Internationalconsiderado bajo en sodio, tendr 150 mg de sodio.  Sodio reducido: 25% menos de Industrial/product designersodio en una porcin. Por ejemplo, si un alimento que por lo general contiene 400mg  de sodio se modifica para convertirse en un alimento de sodio reducido, tendr 300mg  de sodio. QU ALIMENTOS PUEDO COMER? Cereales  Cereales con bajo contenido de sodio, como Fredericaavena, arroz y trigo Revereinflado, y trigo triturado. Galletas con bajo contenido de Alansonsodio. Arroz y pastas sin sal. Pan con bajo contenido de [place] Citysodio. Verduras  Verduras frescas o congeladas. Verduras enlatadas con bajo contenido de sodio o reducido de sodio. Pasta y salsa de tomate con contenido bajo o reducido de sodio. Jugos de tomate y verduras con contenido bajo o reducido de sodio. Nils PyleFrutas  Frutas frescas, congeladas y Primary school teacherenlatadas. Jugo de frutas. Carnes y otros productos con protenas  Atn y salmn enlatado con bajo contenido de Arkdalesodio. Carne de vaca o ave, pescado y frutos de mar frescos o congelados. Cordero. Frutos secos sin sal. Lentejas, frijoles y guisantes secos, sin sal agregada. Frijoles enlatados sin sal. Sopas caseras sin sal. Huevos. Lcteos  Leche. Leche de soja. Queso ricota. Quesos con contenido bajo o reducido de sodio. Yogur. Condimentos  Hierbas y especias frescas y secas. Aderezos sin sal. Cebolla y ajo en polvo. Variedades de mostaza y ketchup con bajo contenido de [place]-by-the-Seasodio. Rbano picante fresco o refrigerado. Jugo de  limn. Grasas y aceites  Aderezos para ensalada con contenido reducido de Sturgissodio. Mantequilla sin sal. Otros  Palomitas de maz y pretzels sin sal. Los artculos mencionados arriba pueden no ser Raytheonuna lista completa de las bebidas o los alimentos recomendados. Comunquese con el nutricionista para conocer ms opciones.  QU ALIMENTOS NO SE RECOMIENDAN? Cereales  Cereales instantneos para comer caliente. Mezclas para bizcochos, panqueques y rellenos de pan. Crutones. Mezclas para pastas o arroz con condimento. Envases comerciales de sopa de fideos. Macarrones con queso envasados o congelados. Harina leudante. Galletas saladas comunes. Verduras  Verduras enlatadas comunes. Pasta y salsa de tomate en lata comunes. Jugos comunes de tomate y de verduras. Verduras Hydrologistcongeladas en salsa. Papas fritas saladas. Aceitunas. Pepinillos. Salsas. Chucrut. Salsa. Carnes y otros productos con protenas  Carne de  vaca, pescado o frutos de mar que est salada, Bastian, [place], condimentada con especias o con pickles. Panceta, jamn, salchichas, perros calientes, carne curada, carne picada (carne envasada de buey) y embutidos. Cerdo salado. Cecina o charqui. Arenque en escabeche. Anchoas, atn enlatado comn y sardinas. Frutos secos con sal. Celine Mans para untar y quesos procesados. Requesn. Queso azul y cottage. Suero de [place]. Condimentos  Sal de cebolla y ajo, sal condimentada, sal de mesa y sal marina. Salsas en lata y envasadas. Salsa [place]. Salsa trtara. Salsa barbacoa. Salsa teriyaki. Salsa de soja, incluso la que tiene contenido reducido de [place]. Salsa de carne. Salsa de pescado. Salsa de [place]. Salsa rosada. Rbano picante envasado. Ketchup y mostaza comunes. Saborizantes y tiernizantes para carne. Caldo en cubitos. Salsa picante. Salsa tabasco. Adobos. Aderezos para tacos. Salsas. Grasas y aceites  Aderezos comunes para ensalada. Mantequilla con sal. Margarina. Mantequilla clarificada.  Grasa de panceta. Otros  Nachos y papas fritas envasadas. Maz inflado y frituras de maz. Palomitas de maz y pretzels con sal. Sopas enlatadas o en polvo. Pizza. Pasteles y entradas congeladas. Los artculos mencionados arriba pueden no ser Raytheon de las bebidas y los alimentos que se Theatre stage manager. Comunquese con el nutricionista para obtener ms informacin.  Esta informacin no tiene Theme park manager el consejo del mdico. Asegrese de hacerle al mdico cualquier pregunta que tenga. Document Released: 01/30/2005 Document Revised: 02/20/2014 Document Reviewed: 12/04/2012 Elsevier Interactive Patient Education  2017 [employer].

## 2016-01-19 NOTE — Progress Notes (Deleted)
146

## 2016-01-20 ENCOUNTER — Other Ambulatory Visit: Payer: Self-pay | Admitting: Internal Medicine

## 2016-01-20 LAB — MICROALBUMIN / CREATININE URINE RATIO
CREATININE, URINE: 26 mg/dL (ref 20–320)
MICROALB UR: 0.5 mg/dL
MICROALB/CREAT RATIO: 19 ug/mg{creat} (ref <30)

## 2016-01-20 MED ORDER — GLUCOSE BLOOD VI STRP: ORAL_STRIP | 12 refills | Status: DC

## 2016-01-20 MED ORDER — TRUEPLUS LANCETS 26G MISC: 1.0000 | Freq: Three times a day (TID) | 12 refills | Status: DC | PRN

## 2016-01-20 MED ORDER — TRUE METRIX GO GLUCOSE METER W/DEVICE KIT: 1.0000 | PACK | Freq: Three times a day (TID) | 0 refills | Status: AC | PRN

## 2016-01-20 MED ORDER — LEVOTHYROXINE SODIUM 50 MCG PO TABS: 50.0000 ug | ORAL_TABLET | Freq: Every day | ORAL | 2 refills | Status: DC

## 2016-01-20 MED FILL — TRUE METRIX BLOOD GLUCOSE M: W/DEVICE | 30 days supply | Qty: 1 | Fill #0

## 2016-01-20 MED FILL — TRUE METRIX TEST STRIP: 30 days supply | Qty: 100 | Fill #0

## 2016-01-20 MED FILL — TRUEplus LANCETS 28G MISC: 30 days supply | Qty: 100 | Fill #0

## 2016-01-20 MED FILL — LEVOTHYROXINE 50 MCG TABLET: 50 | 30 days supply | Qty: 30 | Fill #0

## 2016-01-27 ENCOUNTER — Telehealth: Payer: Self-pay

## 2016-01-27 NOTE — Telephone Encounter (Signed)
[employer] Kristin Wall ID: [id number] contacted pt to go over lab results pt didn't answer was unable lvm

## 2016-03-08 MED FILL — metFORMIN HCL 1000 MG TABS: 1000 | 30 days supply | Qty: 60 | Fill #1

## 2016-03-08 MED FILL — LEVOTHYROXINE 50 MCG TABLET: 50 | 30 days supply | Qty: 30 | Fill #1

## 2016-04-17 MED FILL — metFORMIN HCL 1000 MG TABS: 1000 | 30 days supply | Qty: 60 | Fill #2

## 2016-04-20 MED FILL — LEVOTHYROXINE 50 MCG TABLET: 50 | 30 days supply | Qty: 30 | Fill #2

## 2016-05-25 MED FILL — ?METFORMIN HCL 1,000 MG TAB: 1000 | 30 days supply | Qty: 60 | Fill #3

## 2016-05-25 MED FILL — LEVOTHYROXINE 50 MCG TABLET: 50 | 30 days supply | Qty: 30 | Fill #3

## 2016-06-06 ENCOUNTER — Encounter: Payer: Self-pay | Admitting: Internal Medicine

## 2016-06-06 ENCOUNTER — Ambulatory Visit: Payer: Self-pay | Attending: Internal Medicine | Admitting: Internal Medicine

## 2016-06-06 VITALS — BP 128/78 | HR 79 | Temp 97.9°F | Resp 18 | Ht 65.0 in | Wt 229.0 lb

## 2016-06-06 DIAGNOSIS — E785 Hyperlipidemia, unspecified: Secondary | ICD-10-CM | POA: Insufficient documentation

## 2016-06-06 DIAGNOSIS — N912 Amenorrhea, unspecified: Secondary | ICD-10-CM | POA: Insufficient documentation

## 2016-06-06 DIAGNOSIS — E1165 Type 2 diabetes mellitus with hyperglycemia: Secondary | ICD-10-CM | POA: Insufficient documentation

## 2016-06-06 DIAGNOSIS — E039 Hypothyroidism, unspecified: Secondary | ICD-10-CM | POA: Insufficient documentation

## 2016-06-06 DIAGNOSIS — Z124 Encounter for screening for malignant neoplasm of cervix: Secondary | ICD-10-CM | POA: Insufficient documentation

## 2016-06-06 DIAGNOSIS — Z79899 Other long term (current) drug therapy: Secondary | ICD-10-CM | POA: Insufficient documentation

## 2016-06-06 DIAGNOSIS — E119 Type 2 diabetes mellitus without complications: Secondary | ICD-10-CM

## 2016-06-06 DIAGNOSIS — Z7984 Long term (current) use of oral hypoglycemic drugs: Secondary | ICD-10-CM | POA: Insufficient documentation

## 2016-06-06 DIAGNOSIS — E559 Vitamin D deficiency, unspecified: Secondary | ICD-10-CM | POA: Insufficient documentation

## 2016-06-06 DIAGNOSIS — E0865 Diabetes mellitus due to underlying condition with hyperglycemia: Secondary | ICD-10-CM

## 2016-06-06 LAB — POCT GLYCOSYLATED HEMOGLOBIN (HGB A1C): Hemoglobin A1C: 7.1

## 2016-06-06 LAB — POCT URINE PREGNANCY: PREG TEST UR: NEGATIVE

## 2016-06-06 MED ORDER — METFORMIN HCL 1000 MG PO TABS: 1000.0000 mg | ORAL_TABLET | Freq: Two times a day (BID) | ORAL | 3 refills | Status: DC

## 2016-06-06 MED ORDER — ATORVASTATIN CALCIUM 20 MG PO TABS: 20.0000 mg | ORAL_TABLET | Freq: Every day | ORAL | 2 refills | Status: DC

## 2016-06-06 NOTE — Progress Notes (Signed)
Kristin Wall, is a 36 y.o. female  XFG:[id number]  RCV:[id number]  DOB - [birth year]  Chief Complaint  Patient presents with  . Gynecologic Exam        Subjective:   Kristin Wall is a 36 y.o. female here today for a follow up visit, last seen 01/19/16, here for fu dm/hypothyroidism/papsmear. Per pt, no menses since December, she did home urine pregnancy test last week which was negative.  Denies any abnml breast mass/nipple or vaginal discharge.  She is taking all but glyburide - she did not know that was part of her med list.  Patient has No headache, No chest pain, No abdominal pain - No Nausea, No new weakness tingling or numbness, No Cough - SOB.  Interpreter was used to communicate directly with patient for the entire encounter including providing detailed patient instructions.   No problems updated.  ALLERGIES: No Known Allergies  PAST MEDICAL HISTORY: Past Medical History:  Diagnosis Date  . Amenorrhea     MEDICATIONS AT HOME: Prior to Admission medications   Medication Sig Start Date End Date Taking? Authorizing Provider  atorvastatin (LIPITOR) 20 MG tablet Take 1 tablet (20 mg total) by mouth daily. 06/06/16   Maren Reamer, MD  Blood Glucose Monitoring Suppl (TRUE METRIX GO GLUCOSE METER) w/Device KIT 1 each by Does not apply route every 8 (eight) hours as needed. 01/20/16   Maren Reamer, MD  glucose blood test strip Use as instructed 01/20/16   Maren Reamer, MD  levothyroxine (SYNTHROID, LEVOTHROID) 50 MCG tablet Take 1 tablet (50 mcg total) by mouth daily before breakfast. 01/20/16   Maren Reamer, MD  medroxyPROGESTERone (PROVERA) 10 MG tablet Take 1 tablet (10 mg total) by mouth daily. Patient not taking: Reported on 10/13/2015 05/21/13   Donnamae Jude, MD  metFORMIN (GLUCOPHAGE) 1000 MG tablet Take 1 tablet (1,000 mg total) by mouth 2 (two) times daily with a meal. 06/06/16   Maren Reamer, MD  TRUEPLUS LANCETS 26G MISC 1 each by  Does not apply route every 8 (eight) hours as needed. 01/20/16   Maren Reamer, MD     Objective:   Vitals:   06/06/16 0945  BP: 128/78  Pulse: 79  Resp: 18  Temp: 97.9 F (36.6 C)  TempSrc: Oral  SpO2: 98%  Weight: 229 lb (103.9 kg)  Height: 5' 5" (1.651 m)   cma assisted papsmear  Exam General appearance : Awake, alert, not in any distress. Speech Clear. Not toxic looking, morbid obese, pleasant. HEENT: Atraumatic and Normocephalic, pupils equally reactive to light. Neck: supple, no JVD. No cervical lymphadenopathy.  Chest:Good air entry bilaterally, no added sounds. Breast /axilla: very large breast, bilat nml appearance, not dippling noted. No palpable masses/nodules/nipple discharge noted on exam  CVS: S1 S2 regular, no murmurs/gallups or rubs. Abdomen: Bowel sounds active, Non tender and not distended with no gaurding, rigidity or rebound. Pelvic Exam: Cervix normal in appearance, easy friability, external genitalia normal, no adnexal masses or tenderness, no cervical motion tenderness, rectovaginal septum normal, uterus normal size, shape, and consistency and vagina normal without discharge   Extremities: B/L Lower Ext shows no edema, both legs are warm to touch Neurology: Awake alert, and oriented X 3, CN II-XII grossly intact, Non focal Skin:No Rash  Data Review Lab Results  Component Value Date   HGBA1C 7.9 01/19/2016   HGBA1C 6.9 10/13/2015   HGBA1C 7.3 04/21/2015    Depression screen [facility] 2/9 06/06/2016 01/19/2016 10/13/2015  Decreased Interest 0 0 0  Down, Depressed, Hopeless 0 0 0  PHQ - 2 Score 0 0 0      Assessment & Plan   1. Diabetes mellitus due to underlying condition with hyperglycemia, without long-term current use of insulin (HCC) - continue metformin 1000bid, pt denies problems taking metformin - CBC with Differential - CMP and Liver - HgB A1c 7.1 (from 7.9) - ada diet discussed, recd increase exercise - dc glyburide since not  taking. - - metFORMIN (GLUCOPHAGE) 1000 MG tablet; Take 1 tablet (1,000 mg total) by mouth 2 (two) times daily with a meal.  Dispense: 180 tablet; Refill: 3  2. Hypothyroidism, unspecified type Will rechk levels prior to renewing - TSH  3. Dyslipidemia Renewed statin. - atorvastatin (LIPITOR) 20 MG tablet; Take 1 tablet (20 mg total) by mouth daily.  Dispense: 90 tablet; Refill: 2  4. Vitamin D deficiency - VITAMIN D 25 Hydroxy (Vit-D Deficiency, Fractures)  5. Pap smear for cervical cancer screening - Cytology - PAP - POCT urine pregnancy  Negative - FSH/LH  6. Amenorrhea Suspect due to PCOS in setting of morbid obesity. - FSH/LH - already on metformin.     Patient have been counseled extensively about nutrition and exercise  Return in about 3 months (around 09/05/2016), or if symptoms worsen or fail to improve.  The patient was given clear instructions to go to ER or return to medical center if symptoms don't improve, worsen or new problems develop. The patient verbalized understanding. The patient was told to call to get lab results if they haven't heard anything in the next week.   This note has been created with Dragon speech recognition software and smart phrase technology. Any transcriptional errors are unintentional.   Dawn T Langeland, MD, MBA/MHA [facility] [place], [place] [phone]   06/06/2016, 10:31 AM 

## 2016-06-06 NOTE — Progress Notes (Signed)
Patient is here for PAP  Patient denies pain at this time.  Patient has taken medication today. Patient has eaten today.  Patient request refills on medications.

## 2016-06-06 NOTE — Patient Instructions (Signed)
La diabetes mellitus y los alimentos (Diabetes Mellitus and Food) Es importante que controle su nivel de azcar en la sangre (glucosa). El nivel de glucosa en sangre depende en gran medida de lo que usted come. Comer alimentos saludables en las cantidades [place] a lo largo del Training and development officer, aproximadamente a la misma hora [employer], lo ayudar a Chief Technology Officer su nivel de Multimedia programmer. Tambin puede ayudarlo a retrasar o Patent attorney de la diabetes mellitus. Comer de [employer] saludable incluso puede ayudarlo a Chartered loss adjuster de presin arterial y a Science writer o Theatre manager un peso saludable. Entre las recomendaciones generales para alimentarse y Audiological scientist los alimentos de forma saludable, se incluyen las siguientes:  Respetar las comidas principales y comer colaciones con regularidad. Evitar pasar largos perodos sin comer con el fin de perder peso.  Seguir una dieta que consista principalmente en alimentos de origen vegetal, como frutas, vegetales, frutos secos, legumbres y cereales integrales.  Utilizar mtodos de coccin a baja temperatura, como hornear, en lugar de mtodos de coccin a alta temperatura, como frer en abundante aceite. Trabaje con el nutricionista para aprender a Financial planner nutricional de las etiquetas de los alimentos. CMO PUEDEN AFECTARME LOS ALIMENTOS? Carbohidratos Los carbohidratos afectan el nivel de glucosa en sangre ms que cualquier otro tipo de alimento. El nutricionista lo ayudar a Teacher, adult education cuntos carbohidratos puede consumir en cada comida y ensearle a contarlos. El recuento de carbohidratos es importante para mantener la glucosa en sangre en un nivel saludable, en especial si utiliza insulina o toma determinados medicamentos para la diabetes mellitus. Alcohol El alcohol puede provocar disminuciones sbitas de la glucosa en sangre (hipoglucemia), en especial si utiliza insulina o toma determinados medicamentos para la diabetes mellitus. La  hipoglucemia es una afeccin que puede poner en peligro la vida. Los sntomas de la hipoglucemia (somnolencia, mareos y Data processing manager) son similares a los sntomas de haber consumido mucho alcohol. Si el mdico lo autoriza a beber alcohol, hgalo con moderacin y siga estas pautas:  Las mujeres no deben beber ms de un trago por da, y los hombres no deben beber ms de dos tragos por Training and development officer. Un trago es igual a:  12 onzas (355 ml) de cerveza  5 onzas de vino (150 ml) de vino  1,5onzas (23m) de bebidas espirituosas  No beba con el estmago vaco.  Mantngase hidratado. Beba agua, gaseosas dietticas o t helado sin azcar.  Las gaseosas comunes, los jugos y otros refrescos podran contener muchos carbohidratos y se dCivil Service fast streamer QU ALIMENTOS NO SE RECOMIENDAN? Cuando haga las elecciones de alimentos, es importante que recuerde que todos los alimentos son distintos. Algunos tienen menos nutrientes que otros por porcin, aunque podran tener la misma cantidad de caloras o carbohidratos. Es difcil darle al cuerpo lo que necesita cuando consume alimentos con menos nutrientes. Estos son algunos ejemplos de alimentos que debera evitar ya que contienen muchas caloras y carbohidratos, pero pocos nutrientes:  GPhysicist, medicaltrans (la mayora de los alimentos procesados incluyen grasas trans en la etiqueta de Informacin nutricional).  Gaseosas comunes.  Jugos.  Caramelos.  Dulces, como tortas, pasteles, rosquillas y gYoung[place]  Comidas fritas. QU ALIMENTOS PUEDO COMER? Consuma alimentos ricos en nutrientes, que nutrirn el cuerpo y lo mantendrn saludable. Los alimentos que debe comer tambin dependern de varios factores, como:  Las caloras que necesita.  Los medicamentos que toma.  Su peso.  El nivel de glucosa en sElizabeth  El nCalhoun Cityde presin arterial.  El nivel de colesterol. Debe consumir  una amplia variedad de alimentos, por ejemplo:  Protenas.  Cortes de [employer].  Protenas con bajo contenido de grasas saturadas, como pescado, clara de huevo y frijoles. Evite las carnes procesadas.  Frutas y vegetales.  Frutas y Photographer que pueden ayudar a Chief Technology Officer los niveles sanguneos de [place], como [place], mangos y batatas.  Productos lcteos.  Elija productos lcteos sin grasa o con bajo contenido de [place], como [place], yogur y [place].  Cereales, panes, pastas y arroz.  Elija cereales integrales, como panes multicereales, avena en grano y arroz integral. Estos alimentos pueden ayudar a controlar la presin arterial.  Daphene Jaeger.  Alimentos que contengan grasas saludables, como frutos secos, Musician, aceite de [place], aceite de canola y pescado. TODOS LOS QUE PADECEN DIABETES MELLITUS TIENEN EL [place] PLAN DE [place]? Dado que todas las personas que padecen diabetes mellitus son distintas, no hay un solo plan de comidas que funcione para todos. Es muy importante que se rena con un nutricionista que lo ayudar a crear un plan de comidas adecuado para usted. Esta informacin no tiene Marine scientist el consejo del mdico. Asegrese de hacerle al mdico cualquier pregunta que tenga. Document Released: 05/09/2007 Document Revised: 02/20/2014 Document Reviewed: 12/27/2012 Elsevier Interactive Patient Education  2017 [place].  -  Diabetes mellitus y actividad fsica (Diabetes Mellitus and Exercise) Hacer actividad fsica habitualmente es importante para el estado de salud general, en especial si tiene diabetes (diabetes mellitus). La actividad fsica no solo se reduce a Pharmacist, hospital. Aporta muchos beneficios para la salud, como aumentar la fuerza muscular y la densidad sea, y reducir las grasas corporales y Dealer. Esto mejora el estado fsico, la flexibilidad y la resistencia, y todo ello redunda en un mejor estado de salud general. La actividad fsica tiene beneficios adicionales para los diabticos, entre ellos:  Disminuye el  apetito.  Ayuda a bajar y Consulting civil engineer glucemia bajo control.  Baja la presin arterial.  Ayuda a controlar las cantidades de sustancias grasas (lpidos) en la [place], como el colesterol y los triglicridos.  Mejora la respuesta del cuerpo a la insulina (optimizacin de la sensibilidad a la insulina).  Reduce la cantidad de insulina que el cuerpo necesita.  Reduce el riesgo de sufrir cardiopata coronaria de la siguiente forma:  [employer] de colesterol y triglicridos.  Aumenta los niveles de colesterol bueno.  Disminuye la glucemia. QU ES EL PLAN DE ACTIVIDADES? El mdico o un educador para la diabetes certificado pueden ayudarlo a Engineer, petroleum del tipo y de la frecuencia de actividad fsica (plan de actividades) adecuado para usted. Asegrese de lo siguiente:  Haga por lo menos 145mnutos semanales de ejercicios de intensidad moderada o vigorosa. Estos podran ser caminatas dinmicas, ciclismo o gBenin  Haga ejercicios de elongacin y de fortalecimiento, como yoga o levantamiento de pesas, por lo menos 2veces por semana.  Reparta la actividad en al menos 3das de la semana.  Haga algn tipo de actividad fsica tUS Airways  No deje pasar ms de 2das seguidos sin hacer algn tipo de actividad fsica.  No permanezca inactivo durante ms de 932mutos seguidos. Tmese descansos frecuentes para caminar o estirarse.  Elija un tipo de ejercicio o de actividad que disfrute y establezca objetivos realistas.  Comience lentamente y aumente de maMozambiqueradual la intensidad del ejercicio con el correr del tiBerwickQU DEBO SABER ACERCA DEL CONTROL DE LA DIABETES?  Contrlese la glucemia antes y despus de ejercitarse.  Si la glucemia  supera los '240mg'$ /dl (13,40mol/l) antes de ejercitarse, hgase un control de la orina para detectar la presencia de cetonas. Si tiene cEmerson Electricorina, no se ejercite hasta que la glucemia se  normalice.  Conozca los sntomas de la glucemia baja (hipoglucemia) y aprenda cmo tratarla. El riesgo de tener hipoglucemia aSerbiadurante y despus de hacer actividad fsica. Los sntomas frecuentes de hipoglucemia pueden incluir los siguientes:  HHytop  Ansiedad.  Sudoracin y piel hmeda.  Confusin.  Mareos o sensacin de desvanecimiento.  Aumento de la frecuencia cardaca o palpitaciones.  Visin borrosa.  Hormigueo o adormecimiento alrededor dExxon Mobil Corporation los labios o la lColeraine  Temblores o sacudidas.  Irritabilidad.  Tenga una colacin de carbohidratos de accin rpida disponible antes, durante y despus de ejercitarse, a fin de evitar o tratar la hipoglucemia.  Evite inyectarse insulina en las zonas del cuerpo que ejercitar. Por ejemplo, evite inyectarse insulina en:  Los brazos, si juega al tenis.  Las piernas, si corre.  Lleve registros de sus hbitos de actividad fsica. Esto puede ayudarlos a usted y al mdico a aTax adviserde control de la diabetes segn sea necesario. Escriba los siguientes datos:  Los alimentos que consume antes y despus de hField seismologistactividad fsica.  Los niveles de glucosa en la sangre antes y despus de hacer ejercicios.  El tipo y cantidad de aSamoafsica que rMusician  Cuando se prev que la insulina alcance su valor mximo, si uCanadainsulina. No haga actividad fsica en los momentos en que insulina alcanza su valor mximo.  Cuando comience un ejercicio o una actividad nuevos, trabaje con el mdico para asegurarse de que la actividad sea segura para usted y para aSecretary/administratorla iLake Wylie los medicamentos o la ingesta de alimentos segn sea necesario.  Beba gran cantidad de agua mientras hace ejercicios para evitar la deshidratacin o los golpes de cFreight forwarder Beba suficiente lquido para mConsulting civil engineerorina clara o de color amarillo plido. Esta informacin no tiene cMarine scientistel consejo del mdico. Asegrese de hacerle al mdico  cualquier pregunta que tenga. Document Released: 02/19/2007 Document Revised: 05/24/2015 Document Reviewed: 07/12/2015 Elsevier Interactive Patient Education  2017 ENess(Health Maintenance, Female) Un estilo de vida saludable y los cuidados preventivos pueden favorecer considerablemente a la salud y eMusician Pregunte a su mdico cul es el cronograma de exmenes peridicos apropiado para usted. Esta es una buena oportunidad para consultarlo sobre cmo prevenir enfermedades y mBaxtervillesano. Adems de los controles, hay muchas otras cosas que puede hacer usted mismo. Los expertos han realizado numerosas investigaciones sArvinMeritorcambios en el estilo de vida y las medidas de prevencin que, m[place] lo ayudarn a mantenerse sano. Solicite a su mdico ms informacin. EL PESO Y LA DIETA Consuma una dieta saludable.  Asegrese de iFamily Dollar Storesverduras, frutas, productos lcteos de bajo contenido de gDjiboutiy pAdvertising account planner  No consuma muchos alimentos de alto contenido de grasas slidas, azcares agregados o sal.  Realice actividad fsica con regularidad. Esta es una de las prcticas ms importantes que puede hacer por su salud.  La mayora de los adultos deben hacer ejercicio durante al menos 1571mutos por semana. El ejercicio debe aumentar la frecuencia cardaca y prActora transpiracin (ejercicio de inWeir  La mayora de los adultos tambin deben haField seismologistjercicios de elongacin al meToysRuseces a la semana. Agregue esto al su plan de ejercicio de intensidad moderada. Mantenga un  peso saludable.  El ndice de masa corporal [facility]) es una medida que puede utilizarse para identificar posibles problemas de [place]. Proporciona una estimacin de la grasa corporal basndose en el peso y la altura. Su mdico puede ayudarle a Radiation protection practitioner [place] y a Scientist, forensic o Theatre manager un peso saludable.  Para las mujeres de 20aos o  ms:  Un [facility] menor de 18,5 se considera bajo peso.  Un [facility] entre 18,5 y 24,9 es normal.  Un [facility] entre 25 y 29,9 se considera sobrepeso.  Un IMC de 30 o ms se considera obesidad. Observe los niveles de colesterol y lpidos en la sangre.  Debe comenzar a English as a second language teacher de lpidos y Research officer, trade union en la sangre a los 20aos y luego repetirlos cada 79aos.  Es posible que Automotive engineer los niveles de colesterol con mayor frecuencia si:  Sus niveles de lpidos y colesterol son altos.  Es mayor de 50aos.  Presenta un alto riesgo de padecer enfermedades cardacas. DETECCIN DE CNCER Cncer de pulmn  Se recomienda realizar exmenes de deteccin de cncer de pulmn a personas adultas entre 52 y 68 aos que estn en riesgo de Horticulturist, commercial de pulmn por sus antecedentes de consumo de tabaco.  Se recomienda una tomografa computarizada de baja dosis de los [employer] aos a las personas que:  Fuman actualmente.  Hayan dejado el hbito en algn momento en los ltimos 15aos.  Hayan fumado durante 30aos un paquete diario. Un paquete-ao equivale a fumar un promedio de un paquete de cigarrillos diario durante un ao.  Los exmenes de deteccin anuales deben continuar hasta que hayan pasado 15aos desde que dej de fumar.  Ya no debern realizarse si tiene un problema de salud que le impida recibir tratamiento para Science writer de pulmn. Cncer de mama  Practique la autoconciencia de la mama. Esto significa reconocer la apariencia normal de sus mamas y cmo las siente.  Tambin significa realizar autoexmenes regulares de [employer]. Informe a su mdico sobre cualquier cambio, sin importar cun pequeo sea.  Si tiene entre 20 y 51 aos, un mdico debe realizarle un examen clnico de las mamas como parte del examen regular de [place], cada 1 a 3aos.  Si tiene 40aos o ms, debe Information systems manager clnico de las Microsoft. Tambin considere realizarse  una [place] todos los [place].  Si tiene antecedentes familiares de cncer de mama, hable con su mdico para someterse a un estudio gentico.  Si tiene alto riesgo de Chief Financial Officer de mama, hable con su mdico para someterse a Public house manager y [employer].  La evaluacin del gen del cncer de mama (BRCA) se recomienda a mujeres que tengan familiares con cnceres relacionados con el BRCA. Los cnceres relacionados con el BRCA incluyen los siguientes:  Mama.  Ovario.  Trompas.  Cnceres de peritoneo.  Los resultados de la evaluacin determinarn la necesidad de asesoramiento gentico y de [place] de BRCA1 y BRCA2. Cncer de cuello del tero El mdico puede recomendarle que se haga pruebas peridicas de deteccin de cncer de los rganos de la pelvis (ovarios, tero y vagina). Estas pruebas incluyen un examen plvico, que abarca controlar si se produjeron cambios microscpicos en la superficie del cuello del tero (prueba de Papanicolaou). Pueden recomendarle que se haga estas pruebas cada 3aos, a partir de los 21aos.  A las mujeres que [employer] 30 y 46aos, los mdicos pueden recomendarles que se sometan a exmenes plvicos  y pruebas de Papanicolaou cada 40aos, o a la prueba de Papanicolaou y el examen plvico en combinacin con estudios de deteccin del virus del papiloma humano (VPH) cada 5aos. Algunos tipos de VPH aumentan el riesgo de Chief Financial Officer de cuello del tero. La prueba para la deteccin del VPH tambin puede realizarse a mujeres de cualquier edad cuyos resultados de la prueba de Papanicolaou no sean claros.  Es posible que otros mdicos no recomienden exmenes de deteccin a mujeres no embarazadas que se consideran sujetos de bajo riesgo de Chief Financial Officer de pelvis y que no tienen sntomas. Pregntele al mdico si un examen plvico de deteccin es adecuado para usted.  Si ha recibido un tratamiento para Medical illustrator cervical o una enfermedad que podra causar cncer, necesitar realizarse una prueba de Papanicolaou y controles durante al menos 8 aos de concluido el [place]. Si no se ha hecho el Papanicolaou con regularidad, debern volver a evaluarse los factores de riesgo (como tener un nuevo compaero sexual), para Teacher, adult education si debe realizarse los estudios nuevamente. Algunas mujeres sufren problemas mdicos que aumentan la probabilidad de Museum/gallery curator cncer de cuello del tero. En estos casos, el mdico podr [employer] se realicen controles y pruebas de Papanicolaou con ms frecuencia. Cncer colorrectal  Este tipo de cncer puede detectarse y a menudo prevenirse.  Por lo general, los estudios de rutina se deben Medical laboratory scientific officer a Field seismologist a Proofreader de los 53 aos y [place] 32 aos.  Sin embargo, el mdico podr aconsejarle que lo haga antes, si tiene factores de riesgo para el cncer de colon.  Tambin puede recomendarle que use un kit de prueba para Hydrologist en la materia fecal.  Es posible que se use una pequea cmara en el extremo de un tubo para examinar directamente el colon (sigmoidoscopia o colonoscopia) a fin de Hydrographic surveyor formas tempranas de cncer colorrectal.  Los exmenes de rutina generalmente comienzan a los 40aos.  El examen directo del colon se debe repetir cada 5 a 10aos hasta los 75aos. Sin embargo, es posible que se realicen exmenes con mayor frecuencia, si se detectan formas tempranas de plipos precancerosos o pequeos bultos. Cncer de piel  Revise la piel de la cabeza a los pies con regularidad.  Informe a su mdico si aparecen nuevos lunares o los que tiene se modifican, especialmente en su forma y color.  Tambin notifique al mdico si tiene un lunar que es ms grande que el tamao de una goma de lpiz.  Siempre use pantalla solar. Aplique pantalla solar de [place] y repetida a lo largo del Training and development officer.  Protjase usando mangas y [employer], un  sombrero de ala ancha y gafas para el sol, siempre que se encuentre en el exterior. ENFERMEDADES CARDACAS, DIABETES E HIPERTENSIN ARTERIAL  La hipertensin arterial causa enfermedades cardacas y [place] el riesgo de ictus. La hipertensin arterial es ms probable en los siguientes casos:  Las personas que tienen la presin arterial en el extremo del rango normal (100-139/85-89 mm Hg).  Las personas con sobrepeso u obesidad.  Las Retail banker.  Si usted tiene entre 18 y 39 aos, debe medirse la presin arterial cada 3 a 5 aos. Si usted tiene 40 aos o ms, debe medirse la presin arterial [employer]. Debe medirse la presin arterial dos veces: una vez cuando est en un hospital o una clnica y la otra vez cuando est en otro sitio. Registre el promedio de [employer] Stores. Para controlar su presin arterial cuando  no est en un hospital o [place], puede usar lo siguiente:  Jorje Guild automtica para medir la presin arterial en una farmacia.  Un monitor para medir la presin arterial en el hogar.  Si tiene entre 67 y 13 aos, consulte a su mdico si debe tomar aspirina para prevenir el ictus.  Realcese exmenes de deteccin de la diabetes con regularidad. Esto incluye la toma de [place] de sangre para controlar el nivel de azcar en la sangre durante el [place].  Si tiene un peso normal y un bajo riesgo de padecer diabetes, realcese este anlisis cada tres aos despus de los 45aos.  Si tiene sobrepeso y un alto riesgo de padecer diabetes, considere someterse a este anlisis antes o con mayor frecuencia. PREVENCIN DE INFECCIONES HepatitisB  Si tiene un riesgo ms alto de Museum/gallery curator hepatitis B, debe someterse a un examen de deteccin de este virus. Se considera que tiene un alto riesgo de Museum/gallery curator hepatitis B si:  Naci en un pas donde la hepatitis B es frecuente. Pregntele a su mdico qu pases son considerados de Public affairs consultant.  Sus padres nacieron en  un pas de alto riesgo y usted no recibi una vacuna que lo proteja contra la hepatitis B (vacuna contra la hepatitis B).  [place].  [place] agujas para inyectarse drogas.  Vive con alguien que tiene hepatitis B.  Ha tenido sexo con alguien que tiene hepatitis B.  Recibe tratamiento de hemodilisis.  Toma ciertos medicamentos para el cncer, trasplante de rganos y afecciones autoinmunitarias. Hepatitis C  Se recomienda un anlisis de [place] para:  Todos los que nacieron entre 1944 y [phone].  Todas las personas que tengan un riesgo de haber contrado hepatitis C. Enfermedades de transmisin sexual (ETS).  Debe realizarse pruebas de deteccin de enfermedades de transmisin sexual (ETS), incluidas gonorrea y clamidia si:  Es sexualmente activo y es menor de 24aos.  Es mayor de 24aos, y Investment banker, operational informa que corre riesgo de tener este tipo de infecciones.  La actividad sexual ha cambiado desde que le hicieron la ltima prueba de deteccin y tiene un riesgo mayor de Best boy clamidia o Radio broadcast assistant. Pregntele al mdico si usted tiene riesgo.  Si no tiene el VIH, pero corre riesgo de infectarse por el virus, se recomienda tomar diariamente un medicamento recetado para evitar la infeccin. Esto se conoce como profilaxis previa a la exposicin. Se considera que est en riesgo si:  Es [place] sexualmente y no [place] preservativos habitualmente o no conoce el estado del VIH de sus Advertising copywriter.  Se inyecta drogas.  Es [place] sexualmente con Ardelia Mems pareja que tiene VIH. Consulte a su mdico para saber si tiene un alto riesgo de infectarse por el VIH. Si opta por comenzar la profilaxis previa a la exposicin, primero debe realizarse anlisis de deteccin del VIH. Luego, le harn anlisis cada 51mses mientras est tomando los medicamentos para la profilaxis previa a la exposicin. E[facility] Si es premenopusica y puede quedar eRentchler solicite a su mdico asesoramiento previo a la  concepcin.  Si puede quedar embarazada, tome 400 a [id number]([id number]) de cido fAnheuser-Busch  Si desea evitar el embarazo, hable con su mdico sobre el control de la natalidad (anticoncepcin). OSTEOPOROSIS Y MENOPAUSIA  La osteoporosis es una enfermedad en la que los huesos pierden los minerales y la fuerza por el avance de la edad. El resultado pueden ser fracturas graves en los hMexico Beach El riesgo de osteoporosis puede identificarse con  una prueba de densidad sea.  Si tiene 65aos o ms, o si est en riesgo de sufrir osteoporosis y fracturas, pregunte a su mdico si debe someterse a exmenes.  Consulte a su mdico si debe tomar un suplemento de calcio o de vitamina D para reducir el riesgo de osteoporosis.  La menopausia puede presentar ciertos sntomas fsicos y G[employer]er.  La terapia de reemplazo hormonal puede reducir algunos de estos sntomas y Gaffer. Consulte a su mdico para saber si la terapia de reemplazo hormonal es conveniente para usted. INSTRUCCIONES PARA EL CUIDADO EN EL HOGAR  Realcese los estudios de rutina de la salud, dentales y de Public librarian.  [place].  No consuma ningn producto que contenga tabaco, lo que incluye cigarrillos, tabaco de Higher education careers adviser o Psychologist, sport and exercise.  Si est embarazada, no beba alcohol.  Si est amamantando, reduzca el consumo de alcohol y la frecuencia con la que consume.  Si es mujer y no est embarazada limite el consumo de alcohol a no ms de 1 medida por da. Una medida equivale a 12onzas de cerveza, 5onzas de vino o 1onzas de bebidas alcohlicas de alta graduacin.  No consuma drogas.  No comparta agujas.  Solicite ayuda a su mdico si necesita apoyo o informacin para abandonar las drogas.  Informe a su mdico si a menudo se siente deprimido.  Notifique a su mdico si alguna vez ha sido vctima de abuso o si no se siente seguro en su hogar. Esta informacin no tiene Marine scientist  el consejo del mdico. Asegrese de hacerle al mdico cualquier pregunta que tenga. Document Released: 01/19/2011 Document Revised: 02/20/2014 Document Reviewed: 11/03/2014 Elsevier Interactive Patient Education  2017 [employer].

## 2016-06-07 LAB — FSH/LH
FSH: 6.6 m[IU]/mL
LH: 10.1 m[IU]/mL

## 2016-06-07 LAB — CBC WITH DIFFERENTIAL/PLATELET
BASOS ABS: 0 10*3/uL (ref 0.0–0.2)
Basos: 1 %
EOS (ABSOLUTE): 0.1 10*3/uL (ref 0.0–0.4)
Eos: 2 %
HEMOGLOBIN: 14.4 g/dL (ref 11.1–15.9)
Hematocrit: 43.3 % (ref 34.0–46.6)
IMMATURE GRANULOCYTES: 1 %
Immature Grans (Abs): 0 10*3/uL (ref 0.0–0.1)
LYMPHS ABS: 2.2 10*3/uL (ref 0.7–3.1)
Lymphs: 33 %
MCH: 26.9 pg (ref 26.6–33.0)
MCHC: 33.3 g/dL (ref 31.5–35.7)
MCV: 81 fL (ref 79–97)
MONOCYTES: 5 %
Monocytes Absolute: 0.3 10*3/uL (ref 0.1–0.9)
NEUTROS PCT: 58 %
Neutrophils Absolute: 3.9 10*3/uL (ref 1.4–7.0)
Platelets: 301 10*3/uL (ref 150–379)
RBC: 5.35 x10E6/uL — AB (ref 3.77–5.28)
RDW: 14.8 % (ref 12.3–15.4)
WBC: 6.6 10*3/uL (ref 3.4–10.8)

## 2016-06-07 LAB — CMP AND LIVER
ALK PHOS: 99 IU/L (ref 39–117)
ALT: 30 IU/L (ref 0–32)
AST: 25 IU/L (ref 0–40)
Albumin: 4.5 g/dL (ref 3.5–5.5)
BUN: 10 mg/dL (ref 6–20)
Bilirubin Total: 0.3 mg/dL (ref 0.0–1.2)
Bilirubin, Direct: 0.1 mg/dL (ref 0.00–0.40)
CO2: 24 mmol/L (ref 18–29)
CREATININE: 0.54 mg/dL — AB (ref 0.57–1.00)
Calcium: 8.9 mg/dL (ref 8.7–10.2)
Chloride: 97 mmol/L (ref 96–106)
GFR calc Af Amer: 142 mL/min/{1.73_m2} (ref >59)
GFR calc non Af Amer: 124 mL/min/{1.73_m2} (ref >59)
Glucose: 128 mg/dL — ABNORMAL HIGH (ref 65–99)
POTASSIUM: 4.5 mmol/L (ref 3.5–5.2)
Sodium: 137 mmol/L (ref 134–144)
Total Protein: 7.8 g/dL (ref 6.0–8.5)

## 2016-06-07 LAB — CERVICOVAGINAL ANCILLARY ONLY: Wet Prep (BD Affirm): NEGATIVE

## 2016-06-07 LAB — TSH: TSH: 2.76 u[IU]/mL (ref 0.450–4.500)

## 2016-06-07 LAB — VITAMIN D 25 HYDROXY (VIT D DEFICIENCY, FRACTURES): VIT D 25 HYDROXY: 11.6 ng/mL — AB (ref 30.0–100.0)

## 2016-06-08 ENCOUNTER — Other Ambulatory Visit: Payer: Self-pay | Admitting: Internal Medicine

## 2016-06-08 LAB — CYTOLOGY - PAP
Diagnosis: NEGATIVE
HPV (WINDOPATH): NOT DETECTED

## 2016-06-08 MED ORDER — LEVOTHYROXINE SODIUM 50 MCG PO TABS: 50.0000 ug | ORAL_TABLET | Freq: Every day | ORAL | 2 refills | Status: DC

## 2016-06-12 ENCOUNTER — Ambulatory Visit: Payer: Self-pay | Attending: Internal Medicine

## 2016-06-12 LAB — CERVICOVAGINAL ANCILLARY ONLY: HERPES (WINDOWPATH): NEGATIVE

## 2016-06-14 ENCOUNTER — Telehealth: Payer: Self-pay

## 2016-06-14 MED FILL — LEVOTHYROXINE 50 MCG TABLET: 50 | 30 days supply | Qty: 30 | Fill #0

## 2016-06-14 NOTE — Telephone Encounter (Signed)
[employer] Jesus [phone] contacted pt to go over lab results pt didn't answer and was unable to lvm  If pt calls back please give results: Neg herpes on testing. Blood count, kidney and liver function nml, thyroid levels good. I renewed her synthroid - same dose. Hormonal levels do not point towards perimenopause/menopause yet. papsmear neg, no bv, Repeat pap in 3 years recd.

## 2016-06-29 ENCOUNTER — Encounter: Payer: Self-pay | Admitting: Internal Medicine

## 2016-06-30 ENCOUNTER — Encounter: Payer: Self-pay | Admitting: Internal Medicine

## 2016-07-03 ENCOUNTER — Encounter: Payer: Self-pay | Admitting: Internal Medicine

## 2016-08-14 MED FILL — LEVOTHYROXINE 50 MCG TABLET: 50 | 30 days supply | Qty: 30 | Fill #1

## 2016-08-14 MED FILL — metFORMIN HCL 1000 MG TABS: 1000 | 30 days supply | Qty: 60 | Fill #0

## 2016-09-07 ENCOUNTER — Encounter: Payer: Self-pay | Admitting: Family Medicine

## 2016-09-07 ENCOUNTER — Ambulatory Visit: Payer: Self-pay | Attending: Family Medicine | Admitting: Family Medicine

## 2016-09-07 VITALS — BP 130/83 | HR 82 | Temp 98.2°F | Resp 18 | Ht 63.0 in | Wt 229.0 lb

## 2016-09-07 DIAGNOSIS — Z7984 Long term (current) use of oral hypoglycemic drugs: Secondary | ICD-10-CM | POA: Insufficient documentation

## 2016-09-07 DIAGNOSIS — E119 Type 2 diabetes mellitus without complications: Secondary | ICD-10-CM | POA: Insufficient documentation

## 2016-09-07 DIAGNOSIS — E0865 Diabetes mellitus due to underlying condition with hyperglycemia: Secondary | ICD-10-CM

## 2016-09-07 DIAGNOSIS — E039 Hypothyroidism, unspecified: Secondary | ICD-10-CM | POA: Insufficient documentation

## 2016-09-07 DIAGNOSIS — Z79899 Other long term (current) drug therapy: Secondary | ICD-10-CM | POA: Insufficient documentation

## 2016-09-07 LAB — POCT GLYCOSYLATED HEMOGLOBIN (HGB A1C): HEMOGLOBIN A1C: 7.7

## 2016-09-07 LAB — GLUCOSE, POCT (MANUAL RESULT ENTRY): POC GLUCOSE: 166 mg/dL — AB (ref 70–99)

## 2016-09-07 MED ORDER — METFORMIN HCL 1000 MG PO TABS: 1000.0000 mg | ORAL_TABLET | Freq: Two times a day (BID) | ORAL | 3 refills | Status: DC

## 2016-09-07 MED ORDER — GLUCOSE BLOOD VI STRP: ORAL_STRIP | 12 refills | Status: AC

## 2016-09-07 MED ORDER — GLIPIZIDE 5 MG PO TABS: 2.5000 mg | ORAL_TABLET | Freq: Every day | ORAL | 2 refills | Status: DC

## 2016-09-07 MED ORDER — TRUEPLUS LANCETS 26G MISC: 1.0000 | Freq: Three times a day (TID) | 12 refills | Status: AC | PRN

## 2016-09-07 NOTE — Progress Notes (Signed)
Subjective:  Patient ID: Kristin Wall, female    DOB: 12-Dec-1980  Age: 36 y.o. MRN: [id number]  CC: Diabetes   HPI Kristin Wall presents for for follow up of diabetes. Symptoms: none. Patient denies foot ulcerations, nausea, paresthesia of the feet, polydipsia, polyuria, visual disturbances and vomitting.  Evaluation to date has been included: fasting lipid panel, hemoglobin A1C and microalbuminuria.  Home sugars: She reports not checking her CBG's for 1 week.  Treatment to date: metformin. History of hypothyroidism. Patient denies denies fatigue, weight changes, heat/cold intolerance, bowel/skin changes or CVS symptoms.    Outpatient Medications Prior to Visit  Medication Sig Dispense Refill  . atorvastatin (LIPITOR) 20 MG tablet Take 1 tablet (20 mg total) by mouth daily. 90 tablet 2  . Blood Glucose Monitoring Suppl (TRUE METRIX GO GLUCOSE METER) w/Device KIT 1 each by Does not apply route every 8 (eight) hours as needed. 1 kit 0  . levothyroxine (SYNTHROID, LEVOTHROID) 50 MCG tablet Take 1 tablet (50 mcg total) by mouth daily before breakfast. 90 tablet 2  . medroxyPROGESTERone (PROVERA) 10 MG tablet Take 1 tablet (10 mg total) by mouth daily. (Patient not taking: Reported on 10/13/2015) 10 tablet 3  . glucose blood test strip Use as instructed 100 each 12  . metFORMIN (GLUCOPHAGE) 1000 MG tablet Take 1 tablet (1,000 mg total) by mouth 2 (two) times daily with a meal. 180 tablet 3  . TRUEPLUS LANCETS 26G MISC 1 each by Does not apply route every 8 (eight) hours as needed. 100 each 12   No facility-administered medications prior to visit.     ROS Review of Systems  Constitutional: Negative.   HENT: Negative.   Eyes: Negative.   Respiratory: Negative.   Cardiovascular: Negative.   Gastrointestinal: Negative.   Skin: Negative.     Objective:  BP 130/83 (BP Location: Left Arm, Patient Position: Sitting, Cuff Size: Normal)   Pulse 82   Temp 98.2 F (36.8 C)  (Oral)   Resp 18   Ht 5\' 3"  (1.6 m)   Wt 229 lb (103.9 kg)   SpO2 95%   BMI 40.57 kg/m   BP/Weight 09/07/2016 06/06/2016 01/19/2016  Systolic BP 130 128 151  Diastolic BP 83 78 88  Wt. (Lbs) 229 229 227.66  BMI 40.57 38.11 40.33     Physical Exam  Constitutional: She appears well-developed and well-nourished.  HENT:  Head: Normocephalic and atraumatic.  Right Ear: External ear normal.  Left Ear: External ear normal.  Nose: Nose normal.  Mouth/Throat: Oropharynx is clear and moist.  Eyes: Pupils are equal, round, and reactive to light. Conjunctivae are normal.  Neck: Normal range of motion. Neck supple. No thyromegaly present.  Cardiovascular: Normal rate, regular rhythm, normal heart sounds and intact distal pulses.   Pulmonary/Chest: Effort normal and breath sounds normal.  Abdominal: Soft. Bowel sounds are normal.  Skin: Skin is warm and dry.  Nursing note and vitals reviewed.  Assessment & Plan:   Problem List Items Addressed This Visit      Endocrine   Hypothyroidism   Relevant Orders   TSH (Completed)    Other Visit Diagnoses    Type 2 diabetes mellitus without complication, without long-term current use of insulin (HCC)    -  Primary   Relevant Medications   glipiZIDE (GLUCOTROL) 5 MG tablet   metFORMIN (GLUCOPHAGE) 1000 MG tablet   TRUEPLUS LANCETS 26G MISC   glucose blood test strip   Other Relevant Orders   Glucose (CBG) (  Completed)   HgB A1c (Completed)   Lipid Panel (Completed)      Meds ordered this encounter  Medications  . glipiZIDE (GLUCOTROL) 5 MG tablet    Sig: Take 0.5 tablets (2.5 mg total) by mouth daily before breakfast.    Dispense:  30 tablet    Refill:  2    Order Specific Question:   Supervising Provider    Answer:   Tresa Garter [id number]  . metFORMIN (GLUCOPHAGE) 1000 MG tablet    Sig: Take 1 tablet (1,000 mg total) by mouth 2 (two) times daily with a meal.    Dispense:  180 tablet    Refill:  3    Order Specific  Question:   Supervising Provider    Answer:   Tresa Garter [id number]  . TRUEPLUS LANCETS 26G MISC    Sig: 1 each by Does not apply route every 8 (eight) hours as needed.    Dispense:  100 each    Refill:  12    Order Specific Question:   Supervising Provider    Answer:   Tresa Garter [id number]  . glucose blood test strip    Sig: Use as instructed    Dispense:  100 each    Refill:  12    Order Specific Question:   Supervising Provider    Answer:   Tresa Garter [[id number]]    Follow-up: Return in about 3 months (around 12/08/2016) for Diabetes.   Alfonse Spruce FNP

## 2016-09-07 NOTE — Progress Notes (Signed)
Patient is here for f/up  

## 2016-09-07 NOTE — Patient Instructions (Signed)
La diabetes mellitus y los alimentos (Diabetes Mellitus and Food) Es importante que controle su nivel de azcar en la sangre (glucosa). El nivel de glucosa en sangre depende en gran medida de lo que usted come. Comer alimentos saludables en las cantidades adecuadas a lo largo del da, aproximadamente a la misma hora todos los das, lo ayudar a controlar su nivel de glucosa en sangre. Tambin puede ayudarlo a retrasar o evitar el empeoramiento de la diabetes mellitus. Comer de manera saludable incluso puede ayudarlo a mejorar el nivel de presin arterial y a alcanzar o mantener un peso saludable. Entre las recomendaciones generales para alimentarse y cocinar los alimentos de forma saludable, se incluyen las siguientes:  Respetar las comidas principales y comer colaciones con regularidad. Evitar pasar largos perodos sin comer con el fin de perder peso.  Seguir una dieta que consista principalmente en alimentos de origen vegetal, como frutas, vegetales, frutos secos, legumbres y cereales integrales.  Utilizar mtodos de coccin a baja temperatura, como hornear, en lugar de mtodos de coccin a alta temperatura, como frer en abundante aceite. Trabaje con el nutricionista para aprender a usar la informacin nutricional de las etiquetas de los alimentos. CMO PUEDEN AFECTARME LOS ALIMENTOS? Carbohidratos Los carbohidratos afectan el nivel de glucosa en sangre ms que cualquier otro tipo de alimento. El nutricionista lo ayudar a determinar cuntos carbohidratos puede consumir en cada comida y ensearle a contarlos. El recuento de carbohidratos es importante para mantener la glucosa en sangre en un nivel saludable, en especial si utiliza insulina o toma determinados medicamentos para la diabetes mellitus. Alcohol El alcohol puede provocar disminuciones sbitas de la glucosa en sangre (hipoglucemia), en especial si utiliza insulina o toma determinados medicamentos para la diabetes mellitus. La  hipoglucemia es una afeccin que puede poner en peligro la vida. Los sntomas de la hipoglucemia (somnolencia, mareos y desorientacin) son similares a los sntomas de haber consumido mucho alcohol. Si el mdico lo autoriza a beber alcohol, hgalo con moderacin y siga estas pautas:  Las mujeres no deben beber ms de un trago por da, y los hombres no deben beber ms de dos tragos por da. Un trago es igual a:  12 onzas (355 ml) de cerveza  5 onzas de vino (150 ml) de vino  1,5onzas (45ml) de bebidas espirituosas  No beba con el estmago vaco.  Mantngase hidratado. Beba agua, gaseosas dietticas o t helado sin azcar.  Las gaseosas comunes, los jugos y otros refrescos podran contener muchos carbohidratos y se deben contar. QU ALIMENTOS NO SE RECOMIENDAN? Cuando haga las elecciones de alimentos, es importante que recuerde que todos los alimentos son distintos. Algunos tienen menos nutrientes que otros por porcin, aunque podran tener la misma cantidad de caloras o carbohidratos. Es difcil darle al cuerpo lo que necesita cuando consume alimentos con menos nutrientes. Estos son algunos ejemplos de alimentos que debera evitar ya que contienen muchas caloras y carbohidratos, pero pocos nutrientes:  Grasas trans (la mayora de los alimentos procesados incluyen grasas trans en la etiqueta de Informacin nutricional).  Gaseosas comunes.  Jugos.  Caramelos.  Dulces, como tortas, pasteles, rosquillas y galletas.  Comidas fritas. QU ALIMENTOS PUEDO COMER? Consuma alimentos ricos en nutrientes, que nutrirn el cuerpo y lo mantendrn saludable. Los alimentos que debe comer tambin dependern de varios factores, como:  Las caloras que necesita.  Los medicamentos que toma.  Su peso.  El nivel de glucosa en sangre.  El nivel de presin arterial.  El nivel de colesterol. Debe consumir   una amplia variedad de alimentos, por ejemplo:  Protenas.  Cortes de carne  magros.  Protenas con bajo contenido de grasas saturadas, como pescado, clara de huevo y frijoles. Evite las carnes procesadas.  Frutas y vegetales.  Frutas y vegetales que pueden ayudar a controlar los niveles sanguneos de glucosa, como manzanas, mangos y batatas.  Productos lcteos.  Elija productos lcteos sin grasa o con bajo contenido de grasa, como leche, yogur y queso.  Cereales, panes, pastas y arroz.  Elija cereales integrales, como panes multicereales, avena en grano y arroz integral. Estos alimentos pueden ayudar a controlar la presin arterial.  Grasas.  Alimentos que contengan grasas saludables, como frutos secos, aguacate, aceite de oliva, aceite de canola y pescado. TODOS LOS QUE PADECEN DIABETES MELLITUS TIENEN EL MISMO PLAN DE COMIDAS? Dado que todas las personas que padecen diabetes mellitus son distintas, no hay un solo plan de comidas que funcione para todos. Es muy importante que se rena con un nutricionista que lo ayudar a crear un plan de comidas adecuado para usted. Esta informacin no tiene como fin reemplazar el consejo del mdico. Asegrese de hacerle al mdico cualquier pregunta que tenga. Document Released: 05/09/2007 Document Revised: 02/20/2014 Document Reviewed: 12/27/2012 Elsevier Interactive Patient Education  2017 Elsevier Inc.  

## 2016-09-08 LAB — TSH: TSH: 4.57 u[IU]/mL — AB (ref 0.450–4.500)

## 2016-09-08 LAB — LIPID PANEL
CHOL/HDL RATIO: 4.3 ratio (ref 0.0–4.4)
Cholesterol, Total: 187 mg/dL (ref 100–199)
HDL: 44 mg/dL (ref >39)
LDL CALC: 110 mg/dL — AB (ref 0–99)
TRIGLYCERIDES: 167 mg/dL — AB (ref 0–149)
VLDL CHOLESTEROL CAL: 33 mg/dL (ref 5–40)

## 2016-09-13 MED FILL — ?METFORMIN HCL 1,000 MG TAB: 1000 | 30 days supply | Qty: 60 | Fill #0

## 2016-09-13 MED FILL — LEVOTHYROXINE 50 MCG TABLET: 50 | 30 days supply | Qty: 30 | Fill #2

## 2016-09-13 MED FILL — TRUEplus LANCETS 28G MISC: 25 days supply | Qty: 100 | Fill #0

## 2016-09-13 MED FILL — glipiZIDE 5 MG TABS: 5 | 30 days supply | Qty: 15 | Fill #0

## 2016-09-13 MED FILL — TRUE METRIX TEST STRIP: 25 days supply | Qty: 100 | Fill #0

## 2016-09-15 ENCOUNTER — Other Ambulatory Visit: Payer: Self-pay | Admitting: Family Medicine

## 2016-09-15 DIAGNOSIS — E039 Hypothyroidism, unspecified: Secondary | ICD-10-CM

## 2016-09-15 DIAGNOSIS — E782 Mixed hyperlipidemia: Secondary | ICD-10-CM

## 2016-09-15 MED ORDER — LEVOTHYROXINE SODIUM 75 MCG PO TABS: 75.0000 ug | ORAL_TABLET | Freq: Every day | ORAL | 0 refills | Status: DC

## 2016-09-15 MED ORDER — ATORVASTATIN CALCIUM 40 MG PO TABS: 40.0000 mg | ORAL_TABLET | Freq: Every day | ORAL | 2 refills | Status: DC

## 2016-09-28 ENCOUNTER — Telehealth: Payer: Self-pay

## 2016-09-28 NOTE — Telephone Encounter (Signed)
-----   Message from Margaretmary LombardNubia K [place], [place] sent at 09/28/2016  3:16 PM EDT ----- Please provide patient with results.

## 2016-09-28 NOTE — Telephone Encounter (Signed)
CMA call regarding lab results   Patient verify DOB  Patient was aware and understood  

## 2016-09-28 NOTE — Progress Notes (Signed)
Please provide patient with results.

## 2016-10-23 MED FILL — LEVOTHYROXINE 75 MCG TABLET: 75 | 30 days supply | Qty: 30 | Fill #0

## 2016-10-23 MED FILL — ?ATORVASTATIN 40MG TABLET: 40 | 30 days supply | Qty: 30 | Fill #0

## 2016-11-29 MED FILL — ?METFORMIN HCL 1,000 MG TAB: 1000 | 30 days supply | Qty: 60 | Fill #1

## 2016-11-29 MED FILL — LEVOTHYROXINE 75 MCG TABLET: 75 | 30 days supply | Qty: 30 | Fill #1

## 2016-12-08 ENCOUNTER — Ambulatory Visit: Payer: Self-pay | Admitting: Family Medicine

## 2017-01-01 MED FILL — LEVOTHYROXINE 75 MCG TABLET: 75 | 30 days supply | Qty: 30 | Fill #2

## 2017-01-01 MED FILL — ?METFORMIN HCL 1,000 MG TAB: 1000 | 30 days supply | Qty: 60 | Fill #2

## 2017-01-11 ENCOUNTER — Ambulatory Visit: Payer: Self-pay | Attending: Family Medicine | Admitting: Family Medicine

## 2017-01-11 ENCOUNTER — Encounter: Payer: Self-pay | Admitting: Family Medicine

## 2017-01-11 VITALS — BP 122/81 | HR 75 | Temp 98.0°F | Resp 16 | Ht 63.0 in | Wt 223.8 lb

## 2017-01-11 DIAGNOSIS — E782 Mixed hyperlipidemia: Secondary | ICD-10-CM | POA: Insufficient documentation

## 2017-01-11 DIAGNOSIS — E119 Type 2 diabetes mellitus without complications: Secondary | ICD-10-CM | POA: Insufficient documentation

## 2017-01-11 DIAGNOSIS — Z79899 Other long term (current) drug therapy: Secondary | ICD-10-CM | POA: Insufficient documentation

## 2017-01-11 DIAGNOSIS — Z7984 Long term (current) use of oral hypoglycemic drugs: Secondary | ICD-10-CM | POA: Insufficient documentation

## 2017-01-11 DIAGNOSIS — E039 Hypothyroidism, unspecified: Secondary | ICD-10-CM | POA: Insufficient documentation

## 2017-01-11 DIAGNOSIS — Z23 Encounter for immunization: Secondary | ICD-10-CM | POA: Insufficient documentation

## 2017-01-11 LAB — GLUCOSE, POCT (MANUAL RESULT ENTRY): POC Glucose: 122 mg/dL — AB (ref 70–99)

## 2017-01-11 LAB — POCT GLYCOSYLATED HEMOGLOBIN (HGB A1C): Hemoglobin A1C: 7

## 2017-01-11 MED ORDER — ATORVASTATIN CALCIUM 40 MG PO TABS: 40.0000 mg | ORAL_TABLET | Freq: Every day | ORAL | 2 refills | Status: DC

## 2017-01-11 MED ORDER — METFORMIN HCL 1000 MG PO TABS: 1000.0000 mg | ORAL_TABLET | Freq: Two times a day (BID) | ORAL | 3 refills | Status: DC

## 2017-01-11 NOTE — Progress Notes (Signed)
Patient is here for a follow up on Diabetes Mellitus and would like medication refills. Patient would like to also get her thyroid check.

## 2017-01-11 NOTE — Patient Instructions (Signed)
Diabetes mellitus y actividad fsica  (Diabetes Mellitus and Exercise)  Hacer actividad fsica habitualmente es importante para el estado de salud general, en especial si tiene diabetes (diabetes mellitus). La actividad fsica no solo se reduce a bajar de peso. Aporta muchos beneficios para la salud, como aumentar la fuerza muscular y la densidad sea, y reducir las grasas corporales y el estrs. Esto mejora el estado fsico, la flexibilidad y la resistencia, y todo ello redunda en un mejor estado de salud general.  La actividad fsica tiene beneficios adicionales para los diabticos, entre ellos:   Disminuye el apetito.   Ayuda a bajar y mantener la glucemia bajo control.   Baja la presin arterial.   Ayuda a controlar las cantidades de sustancias grasas (lpidos) en la sangre, como el colesterol y los triglicridos.   Mejora la respuesta del cuerpo a la insulina (optimizacin de la sensibilidad a la insulina).   Reduce la cantidad de insulina que el cuerpo necesita.   Reduce el riesgo de sufrir cardiopata coronaria de la siguiente forma:  ? Baja los niveles de colesterol y triglicridos.  ? Aumenta los niveles de colesterol bueno.  ? Disminuye la glucemia.  QU ES EL PLAN DE ACTIVIDADES?  El mdico o un educador para la diabetes certificado pueden ayudarlo a elaborar un plan respecto del tipo y de la frecuencia de actividad fsica (plan de actividades) adecuado para usted. Asegrese de lo siguiente:   Haga por lo menos 150minutos semanales de ejercicios de intensidad moderada o vigorosa. Estos podran ser caminatas dinmicas, ciclismo o gimnasia acutica.  ? Haga ejercicios de elongacin y de fortalecimiento, como yoga o levantamiento de pesas, por lo menos 2veces por semana.  ? Reparta la actividad en al menos 3das de la semana.   Haga algn tipo de actividad fsica todos los das.  ? No deje pasar ms de 2das seguidos sin hacer algn tipo de actividad fsica.  ? No permanezca inactivo durante  ms de 90minutos seguidos. Tmese descansos frecuentes para caminar o estirarse.   Elija un tipo de ejercicio o de actividad que disfrute y establezca objetivos realistas.   Comience lentamente y aumente de manera gradual la intensidad del ejercicio con el correr del tiempo.  QU DEBO SABER ACERCA DEL CONTROL DE LA DIABETES?   Contrlese la glucemia antes y despus de ejercitarse.  ? Si la glucemia supera los 240mg/dl (13,3mmol/l) antes de ejercitarse, hgase un control de la orina para detectar la presencia de cetonas. Si tiene cetonas en la orina, no se ejercite hasta que la glucemia se normalice.   Conozca los sntomas de la glucemia baja (hipoglucemia) y aprenda cmo tratarla. El riesgo de tener hipoglucemia aumenta durante y despus de hacer actividad fsica. Los sntomas frecuentes de hipoglucemia pueden incluir los siguientes:  ? Hambre.  ? Ansiedad.  ? Sudoracin y piel hmeda.  ? Confusin.  ? Mareos o sensacin de desvanecimiento.  ? Aumento de la frecuencia cardaca o palpitaciones.  ? Visin borrosa.  ? Hormigueo o adormecimiento alrededor de la boca, los labios o la lengua.  ? Temblores o sacudidas.  ? Irritabilidad.   Tenga una colacin de carbohidratos de accin rpida disponible antes, durante y despus de ejercitarse, a fin de evitar o tratar la hipoglucemia.   Evite inyectarse insulina en las zonas del cuerpo que ejercitar. Por ejemplo, evite inyectarse insulina en:  ? Los brazos, si juega al tenis.  ? Las piernas, si corre.   Lleve registros de sus hbitos de actividad fsica.   Esto puede ayudarlos a usted y al mdico a adaptar el plan de control de la diabetes segn sea necesario. Escriba los siguientes datos:  ? Los alimentos que consume antes y despus de hacer actividad fsica.  ? Los niveles de glucosa en la sangre antes y despus de hacer ejercicios.  ? El tipo y cantidad de actividad fsica que realiza.  ? Cuando se prev que la insulina alcance su valor mximo, si usa insulina.  No haga actividad fsica en los momentos en que insulina alcanza su valor mximo.   Cuando comience un ejercicio o una actividad nuevos, trabaje con el mdico para asegurarse de que la actividad sea segura para usted y para ajustar la insulina, los medicamentos o la ingesta de alimentos segn sea necesario.   Beba gran cantidad de agua mientras hace ejercicios para evitar la deshidratacin o los golpes de calor. Beba suficiente lquido para mantener la orina clara o de color amarillo plido.  Esta informacin no tiene como fin reemplazar el consejo del mdico. Asegrese de hacerle al mdico cualquier pregunta que tenga.  Document Released: 02/19/2007 Document Revised: 05/24/2015 Document Reviewed: 07/12/2015  Elsevier Interactive Patient Education  2018 Elsevier Inc.

## 2017-01-12 LAB — T4 AND TSH
T4 TOTAL: 10.9 ug/dL (ref 4.5–12.0)
TSH: 1.8 u[IU]/mL (ref 0.450–4.500)

## 2017-01-15 NOTE — Progress Notes (Signed)
Subjective:  Patient ID: Kristin Wall, female    DOB: [birth year]  Age: 36 y.o. MRN: [id number]  CC: Follow-up   HPI Varnell Donate presents for for follow up of diabetes. Symptoms: none. Patient denies foot ulcerations, nausea, paresthesia of the feet, polydipsia, polyuria, visual disturbances and vomitting.  Evaluation to date has been included: fasting lipid panel, hemoglobin A1C and microalbuminuria.  Home sugars: She reports not checking her CBG's for 1 week.  Treatment to date: metformin. History of hypothyroidism. Patient denies denies fatigue, weight changes, heat/cold intolerance, bowel/skin changes or CVS symptoms.    Outpatient Medications Prior to Visit  Medication Sig Dispense Refill  . Blood Glucose Monitoring Suppl (TRUE METRIX GO GLUCOSE METER) w/Device KIT 1 each by Does not apply route every 8 (eight) hours as needed. 1 kit 0  . glucose blood test strip Use as instructed 100 each 12  . levothyroxine (SYNTHROID, LEVOTHROID) 75 MCG tablet Take 1 tablet (75 mcg total) by mouth daily before breakfast. 90 tablet 0  . TRUEPLUS LANCETS 26G MISC 1 each by Does not apply route every 8 (eight) hours as needed. 100 each 12  . atorvastatin (LIPITOR) 40 MG tablet Take 1 tablet (40 mg total) by mouth daily. 30 tablet 2  . glipiZIDE (GLUCOTROL) 5 MG tablet Take 0.5 tablets (2.5 mg total) by mouth daily before breakfast. 30 tablet 2  . metFORMIN (GLUCOPHAGE) 1000 MG tablet Take 1 tablet (1,000 mg total) by mouth 2 (two) times daily with a meal. 180 tablet 3  . medroxyPROGESTERone (PROVERA) 10 MG tablet Take 1 tablet (10 mg total) by mouth daily. (Patient not taking: Reported on 10/13/2015) 10 tablet 3   No facility-administered medications prior to visit.     ROS Review of Systems  Constitutional: Negative.   HENT: Negative.   Eyes: Negative.   Respiratory: Negative.   Cardiovascular: Negative.   Gastrointestinal: Negative.   Skin: Negative.     Objective:  BP  122/81 (BP Location: Left Arm, Patient Position: Sitting, Cuff Size: Normal)   Pulse 75   Temp 98 F (36.7 C) (Oral)   Resp 16   Ht _0  (1.6 m)   Wt 223 lb 12.8 oz (101.5 kg)   SpO2 98%   BMI 39.64 kg/m   BP/Weight 01/11/2017 09/07/2016 7/74/1287  Systolic BP 867 672 094  Diastolic BP 81 83 78  Wt. (Lbs) 223.8 229 229  BMI 39.64 40.57 38.11     Physical Exam  Constitutional: She appears well-developed and well-nourished.  HENT:  Head: Normocephalic and atraumatic.  Right Ear: External ear normal.  Left Ear: External ear normal.  Nose: Nose normal.  Mouth/Throat: Oropharynx is clear and moist.  Eyes: Conjunctivae are normal. Pupils are equal, round, and reactive to light.  Neck: Normal range of motion. Neck supple. No thyromegaly present.  Cardiovascular: Normal rate, regular rhythm, normal heart sounds and intact distal pulses.  Pulmonary/Chest: Effort normal and breath sounds normal.  Abdominal: Soft. Bowel sounds are normal.  Skin: Skin is warm and dry.  Nursing note and vitals reviewed.  Assessment & Plan:   1. Controlled type 2 diabetes mellitus without complication, without long-term current use of insulin (HCC)  - Glucose (CBG) - HgB A1c - metFORMIN (GLUCOPHAGE) 1000 MG tablet; Take 1 tablet (1,000 mg total) by mouth 2 (two) times daily with a meal.  Dispense: 180 tablet; Refill: 3  2. Needs flu shot  - Flu Vaccine QUAD 6+ mos PF IM (Fluarix Quad PF)  3.  Mixed hyperlipidemia  - atorvastatin (LIPITOR) 40 MG tablet; Take 1 tablet (40 mg total) by mouth daily.  Dispense: 30 tablet; Refill: 2  4. Hypothyroidism, unspecified type  - T4 AND TSH     Follow-up: Return in about 3 months (around 04/12/2017) for DM.   Alfonse Spruce FNP

## 2017-01-16 ENCOUNTER — Telehealth: Payer: Self-pay

## 2017-01-16 ENCOUNTER — Other Ambulatory Visit: Payer: Self-pay | Admitting: Family Medicine

## 2017-01-16 DIAGNOSIS — E039 Hypothyroidism, unspecified: Secondary | ICD-10-CM

## 2017-01-16 MED ORDER — LEVOTHYROXINE SODIUM 75 MCG PO TABS: 75.0000 ug | ORAL_TABLET | Freq: Every day | ORAL | 0 refills | Status: DC

## 2017-01-16 NOTE — Telephone Encounter (Signed)
-----   Message from Lizbeth BarkMandesia R Hairston, FNP sent at 01/16/2017  7:33 AM EST ----- Thyroid level normal on current dosage of levothyroxine.  Follow up in 3 months.

## 2017-01-16 NOTE — Telephone Encounter (Signed)
CMA call regarding lab results   Patient did not answer no VM set up yet    

## 2017-01-17 ENCOUNTER — Ambulatory Visit: Payer: Self-pay

## 2017-01-30 MED FILL — ?ATORVASTATIN 40MG TABLET: 40 | 30 days supply | Qty: 30 | Fill #1

## 2017-01-30 MED FILL — ?METFORMIN HCL 1,000 MG TAB: 1000 | 30 days supply | Qty: 60 | Fill #3

## 2017-01-30 MED FILL — LEVOTHYROXINE 75 MCG TABLET: 75 | 30 days supply | Qty: 30 | Fill #0

## 2017-03-07 MED FILL — LEVOTHYROXINE 75 MCG TABLET: 75 | 30 days supply | Qty: 30 | Fill #1

## 2017-03-07 MED FILL — ?METFORMIN HCL 1,000 MG TAB: 1000 | 30 days supply | Qty: 60 | Fill #4

## 2017-03-07 MED FILL — ?ATORVASTATIN 40MG TABLET: 40 | 30 days supply | Qty: 30 | Fill #2

## 2017-04-04 MED FILL — ?METFORMIN HCL 1,000 MG TAB: 1000 | 30 days supply | Qty: 60 | Fill #5

## 2017-04-04 MED FILL — ?ATORVASTATIN 40MG TAB: 40 | 30 days supply | Qty: 30 | Fill #0

## 2017-04-04 MED FILL — LEVOTHYROXINE 75 MCG TABLET: 75 | 30 days supply | Qty: 30 | Fill #2

## 2017-04-12 ENCOUNTER — Ambulatory Visit: Payer: Self-pay | Admitting: Family Medicine

## 2017-05-09 ENCOUNTER — Encounter: Payer: Self-pay | Admitting: Nurse Practitioner

## 2017-05-09 ENCOUNTER — Ambulatory Visit: Payer: Self-pay | Attending: Nurse Practitioner | Admitting: Nurse Practitioner

## 2017-05-09 VITALS — BP 134/89 | HR 77 | Temp 98.4°F | Ht 63.0 in | Wt 226.6 lb

## 2017-05-09 DIAGNOSIS — Z79899 Other long term (current) drug therapy: Secondary | ICD-10-CM | POA: Insufficient documentation

## 2017-05-09 DIAGNOSIS — E1165 Type 2 diabetes mellitus with hyperglycemia: Secondary | ICD-10-CM | POA: Insufficient documentation

## 2017-05-09 DIAGNOSIS — Z7984 Long term (current) use of oral hypoglycemic drugs: Secondary | ICD-10-CM | POA: Insufficient documentation

## 2017-05-09 DIAGNOSIS — E119 Type 2 diabetes mellitus without complications: Secondary | ICD-10-CM

## 2017-05-09 DIAGNOSIS — E0865 Diabetes mellitus due to underlying condition with hyperglycemia: Secondary | ICD-10-CM

## 2017-05-09 DIAGNOSIS — E039 Hypothyroidism, unspecified: Secondary | ICD-10-CM | POA: Insufficient documentation

## 2017-05-09 DIAGNOSIS — E782 Mixed hyperlipidemia: Secondary | ICD-10-CM

## 2017-05-09 DIAGNOSIS — E11649 Type 2 diabetes mellitus with hypoglycemia without coma: Secondary | ICD-10-CM | POA: Insufficient documentation

## 2017-05-09 DIAGNOSIS — Z833 Family history of diabetes mellitus: Secondary | ICD-10-CM | POA: Insufficient documentation

## 2017-05-09 DIAGNOSIS — Z809 Family history of malignant neoplasm, unspecified: Secondary | ICD-10-CM | POA: Insufficient documentation

## 2017-05-09 LAB — GLUCOSE, POCT (MANUAL RESULT ENTRY): POC GLUCOSE: 143 mg/dL — AB (ref 70–99)

## 2017-05-09 LAB — POCT GLYCOSYLATED HEMOGLOBIN (HGB A1C): HEMOGLOBIN A1C: 7.5

## 2017-05-09 MED ORDER — ATORVASTATIN CALCIUM 40 MG PO TABS: 40.0000 mg | ORAL_TABLET | Freq: Every day | ORAL | 0 refills | Status: DC

## 2017-05-09 MED ORDER — METFORMIN HCL 1000 MG PO TABS: 1000.0000 mg | ORAL_TABLET | Freq: Two times a day (BID) | ORAL | 3 refills | Status: DC

## 2017-05-09 MED ORDER — LEVOTHYROXINE SODIUM 75 MCG PO TABS: 75.0000 ug | ORAL_TABLET | Freq: Every day | ORAL | 0 refills | Status: DC

## 2017-05-09 MED FILL — ?ATORVASTATIN 40MG TABLET: 40 | 30 days supply | Qty: 30 | Fill #0

## 2017-05-09 MED FILL — ?METFORMIN HCL 1,000 MG TAB: 1000 | 30 days supply | Qty: 60 | Fill #0

## 2017-05-09 MED FILL — LEVOTHYROXINE 75 MCG TABLET: 75 | 30 days supply | Qty: 30 | Fill #0

## 2017-05-09 NOTE — Progress Notes (Signed)
Assessment & Plan:  Diagnoses and all orders for this visit:  Diabetes mellitus due to underlying condition with hyperglycemia, without long-term current use of insulin (HCC) -     Glucose (CBG) -     HgB A1c -     Microalbumin/Creatinine Ratio, Urine -     Ambulatory referral to Ophthalmology Continue blood sugar control as discussed in office today, low carbohydrate diet, and regular physical exercise as tolerated, 150 minutes per week (30 min each day, 5 days per week, or 50 min 3 days per week). Keep blood sugar logs with fasting goal of 80-130 mg/dl, post prandial less than 180.  For Hypoglycemia: BS <60 and Hyperglycemia BS >400; contact the clinic ASAP. Annual eye exams and foot exams are recommended.   Morbid obesity ([place]) Discussed diet and exercise for person with BMI >25. Instructed: You must burn more calories than you eat. Losing 5 percent of your body weight should be considered a success. In the longer term, losing more than 15 percent of your body weight and staying at this weight is an extremely good result. However, keep in mind that even losing 5 percent of your body weight leads to important health benefits, so try not to get discouraged if you're not able to lose more than this. Will recheck weight in 3-6 months.  Hypothyroidism, unspecified type -     TSH    Patient has been counseled on age-appropriate routine health concerns for screening and prevention. These are reviewed and up-to-date. Referrals have been placed accordingly. Immunizations are up-to-date or declined.    Subjective:   Chief Complaint  Patient presents with  . Establish Care    Patient is here to establish for diabetes and thyroid.   . Medication Refill   HPI Kristin Wall 37 y.o. female presents to office today to establish care.  Diabetes Mellitus Type 2 Checking her blood sugar sporadically. Average 155. She is taking metformin 103m BID and as prescribed. Her A1c has  increased from 7.0 to 7.5. She is not always diet compliant. She is not exercising. Due for an eye exam.  Lab Results  Component Value Date   HGBA1C 7.5 05/09/2017   Hypothyroidism Taking synthroid at least 30 minutes prior to breakfast. Denies any hypo or hyperthyroidism symptoms:  palpitations, chest pain or shortness of breath, diarrhea or constipation.  Lab Results  Component Value Date   TSH 1.800 01/11/2017   Hyperlipidemia Patient presents for follow up to hyperlipidemia.  She is medication compliant. She is not diet compliant and denies chest pain, dyspnea, lower extremity edema and palpitations or statin intolerance including myalgias.  Lab Results  Component Value Date   CHOL 187 09/07/2016   Lab Results  Component Value Date   HDL 44 09/07/2016   Lab Results  Component Value Date   LDLCALC 110 (H) 09/07/2016   Lab Results  Component Value Date   TRIG 167 (H) 09/07/2016   Lab Results  Component Value Date   CHOLHDL 4.3 09/07/2016   Review of Systems  Constitutional: Negative for fever, malaise/fatigue and weight loss.  HENT: Negative.  Negative for nosebleeds.   Eyes: Negative.  Negative for blurred vision, double vision and photophobia.  Respiratory: Negative.  Negative for cough and shortness of breath.   Cardiovascular: Negative.  Negative for chest pain, palpitations and leg swelling.  Gastrointestinal: Negative.  Negative for heartburn, nausea and vomiting.  Musculoskeletal: Negative.  Negative for myalgias.  Neurological: Negative.  Negative for dizziness, focal  weakness, seizures and headaches.  Psychiatric/Behavioral: Negative.  Negative for suicidal ideas.    Past Medical History:  Diagnosis Date  . Amenorrhea   . Diabetes mellitus without complication ([place])   . Thyroid disease     Past Surgical History:  Procedure Laterality Date  . WISDOM TOOTH EXTRACTION      Family History  Problem Relation Age of Onset  . Diabetes Father   . Cancer  Paternal Grandmother     Social History Reviewed with no changes to be made today.   Outpatient Medications Prior to Visit  Medication Sig Dispense Refill  . Blood Glucose Monitoring Suppl (TRUE METRIX GO GLUCOSE METER) w/Device KIT 1 each by Does not apply route every 8 (eight) hours as needed. 1 kit 0  . glucose blood test strip Use as instructed 100 each 12  . TRUEPLUS LANCETS 26G MISC 1 each by Does not apply route every 8 (eight) hours as needed. 100 each 12  . atorvastatin (LIPITOR) 40 MG tablet Take 1 tablet (40 mg total) by mouth daily. 30 tablet 2  . levothyroxine (SYNTHROID, LEVOTHROID) 75 MCG tablet Take 1 tablet (75 mcg total) by mouth daily before breakfast. 90 tablet 0  . metFORMIN (GLUCOPHAGE) 1000 MG tablet Take 1 tablet (1,000 mg total) by mouth 2 (two) times daily with a meal. 180 tablet 3   No facility-administered medications prior to visit.     No Known Allergies     Objective:    BP 134/89 (BP Location: Left Arm, Patient Position: Sitting, Cuff Size: Normal)   Pulse 77   Temp 98.4 F (36.9 C) (Oral)   Ht 5' 3" (1.6 m)   Wt 226 lb 9.6 oz (102.8 kg)   LMP 05/01/2017   SpO2 96%   BMI 40.14 kg/m  Wt Readings from Last 3 Encounters:  05/09/17 226 lb 9.6 oz (102.8 kg)  01/11/17 223 lb 12.8 oz (101.5 kg)  09/07/16 229 lb (103.9 kg)    Physical Exam  Constitutional: She is oriented to person, place, and time. She appears well-developed and well-nourished. She is cooperative.  HENT:  Head: Normocephalic and atraumatic.  Eyes: EOM are normal.  Neck: Normal range of motion.  Cardiovascular: Normal rate, regular rhythm and normal heart sounds. Exam reveals no gallop and no friction rub.  No murmur heard. Pulmonary/Chest: Effort normal and breath sounds normal. No tachypnea. No respiratory distress. She has no decreased breath sounds. She has no wheezes. She has no rhonchi. She has no rales. She exhibits no tenderness.  Abdominal: Soft. Bowel sounds are normal.   Musculoskeletal: Normal range of motion. She exhibits no edema.  Neurological: She is alert and oriented to person, place, and time. Coordination normal.  Skin: Skin is warm and dry.  Psychiatric: She has a normal mood and affect. Her behavior is normal. Judgment and thought content normal.  Nursing note and vitals reviewed.        Patient has been counseled extensively about nutrition and exercise as well as the importance of adherence with medications and regular follow-up. The patient was given clear instructions to go to ER or return to medical center if symptoms don't improve, worsen or new problems develop. The patient verbalized understanding.   Follow-up: Return in about 3 months (around 08/09/2017).   Gildardo Pounds, FNP-BC [facility] and [place], [place]   05/09/2017, 9:35 AM

## 2017-05-09 NOTE — Patient Instructions (Addendum)
Diabetes blood sugar goals  Fasting in AM before breakfast which means at least 8 hrs of no eating or drinking) except water or unsweetened coffee or tea): 90-110 2 hrs after meals: < 160,   Hypoglycemia or low blood sugar: < 70 (You should not have hypoglycemia.)  Aim for 30 minutes of exercise most days. Rethink what you drink. Water is great! Aim for 2-3 Carb Choices per meal (30-45 grams) +/- 1 either way  Aim for 0-15 Carbs per snack if hungry  Include protein in moderation with your meals and snacks  Consider reading food labels for Total Carbohydrate and Fat Grams of foods  Consider checking BG at alternate times per day  Continue taking medication as directed Be mindful about how much sugar you are adding to beverages and other foods. Fruit Punch - find one with no sugar  Measure and decrease portions of carbohydrate foods  Make your plate and don't go back for seconds    IMC en adultos (BMI for Adults) El ndice de masa corporal [facility] es un nmero que se calcula a partir del peso y la estatura de Medical laboratory scientific officer. En la [employer], el nmero se [place] para Chief Strategy Officer qu porcentaje de su peso se compone de [place]. No es tan exacto como una medida directa de la grasa corporal. CMO SE CALCULA EL IMC? El [facility] se calcula al dividir el peso en kilogramos por la estatura en metros elevada al cuadrado. Tambin puede calcularse dividiendo la estatura en libras por la estatura en pulgadas, elevada al cuadrado, y luego multiplicando el resultado por 703. Existen grficos que lo ayudarn a Clinical research associate su [facility] rpida y fcilmente sin Education officer, environmental este clculo. CMO SE INTERPRETA EL IMC? Los mdicos [employer] grficos para identificar si un adulto tiene peso insuficiente, peso normal o sobrepeso, en funcin de las siguientes pautas:  [place] peso: IMC menor que 18,5.  Peso normal: IMC entre 18,5 y 70,9.  Sobrepeso: [facility] entre 25 y 29,9.  Obeso: IMC de 30 o ms. El [facility]  generalmente se interpreta de la TEFL teacher en los hombres que en las mujeres. El peso incluye grasa y [place], de modo que una persona con masa muscular, como por ejemplo un atleta, puede tener un IMC ms alto que 24,9. En estos casos, es posible que el [facility] no represente exactamente la Art gallery manager. Para determinar si el exceso de grasa corporal es la causa de un IMC de 25 o ms, es posible que el mdico necesite realizar ms evaluaciones. POR QU EL IMC ES UNA HERRAMIENTA TIL? El [facility] se [place] para identificar un posible problema de peso que puede estar relacionado con un problema mdico o puede aumentar el riesgo de este tipo de [place]. Tambin se puede usar para promover cambios a fin de Cabin crew peso saludable. Esta informacin no tiene Theme park manager el consejo del mdico. Asegrese de hacerle al mdico cualquier pregunta que tenga. Document Released: 11/20/2012 Document Revised: 02/20/2014 Document Reviewed: 06/27/2013 Elsevier Interactive Patient Education  2018 [employer].  Hacer ejercicio para Psychiatric nurse (Exercising to [employer]) Hacer ejercicio puede ayudarlo a Publishing copy de peso. Para bajar de [employer] ejercicio, este debe ser de intensidad vigorosa. Puede saber que est haciendo ejercicio de intensidad vigorosa si respira con mucha dificultad y rapidez, y no puede mantener una conversacin. El ejercicio de intensidad moderada ayuda a Radio producer peso actual. Puede saber que est haciendo ejercicio de intensidad moderada si tiene una frecuencia cardaca  ms elevada y [place] respiracin ms rpida, pero an puede American Expressmantener una conversacin. CON QU FRECUENCIA DEBO HACER EJERCICIO? Elija una actividad que disfrute y establezca objetivos realistas. El mdico puede ayudarlo a Event organiserelaborar un plan de actividades que funcione para usted. Haga ejercicio regularmente como se lo haya indicado el mdico. Esta puede incluir:  Programme researcher, broadcasting/film/videoealizar entrenamiento de resistencia dos veces por  semana, como: ? Flexiones de First Data Corporationbrazos. ? Abdominales. ? Levantamiento de pesas. ? Ejercicios con bandas elsticas.  Realizar una intensidad determinada de ejercicio durante una cantidad determinada de [place]iempo. Elija entre estas opciones: ? 150minutos de ejercicio de intensidad moderada cada semana. ? 75minutos de ejercicio de intensidad vigorosa cada semana. ? Burlene ArntUna mezcla de ejercicio de intensidad moderada y vigorosa cada semana. Los nios, las mujeres East Auroraembarazadas, las personas que no estn en forma, las personas con sobrepeso y los adultos mayores tal vez tengan que consultar a un mdico para que les d Medical laboratory scientific officerrecomendaciones individuales. Si tiene Owens-Illinoisalguna enfermedad, asegrese de Science writerconsultar al mdico antes de comenzar un programa de ejercicios nuevo. CULES SON ALGUNAS ACTIVIDADES QUE PUEDEN AYUDARME A BAJAR DE PESO?  Caminar a un ritmo de al menos 4,415millas (7kilmetros) por hora.  Trotar o correr a un ritmo de 5 millas (8 kilmetros) por hora.  Andar en bicicleta a un ritmo de al menos 10 millas (16 kilmetros) por hora.  Practicar natacin.  Practicar patinaje sobre ruedas normales o en lnea.  Hacer esqu de fondo.  Hacer deportes competitivos vigorosos, como ftbol americano, bsquet y ftbol.  Saltar la soga.  Tomar clases de baile aerbico. CMO PUEDO SER MS ACTIVO EN MIS ACTIVIDADES DIARIAS?  Utilice las Microbiologistescaleras en lugar del ascensor.  D una caminata durante su hora de almuerzo.  Si conduce, estacione el automvil ms lejos del trabajo o de la escuela.  Si Bots[place]ausa transporte pblico, bjese una parada antes y camine el resto del camino.  Pngase de pie y camine cada vez que haga llamadas telefnicas.  Levntese, estrese y camine cada 30minutos a lo largo del Futures traderda. QU PAUTAS DEBO SEGUIR MIENTRAS HAGO EJERCICIO?  No haga ejercicio en exceso que pudiera hacer que se lastime, se sienta mareado o tenga dificultad para respirar.  Consulte al mdico antes de comenzar un  programa de ejercicios nuevo.  Use ropa cmoda y calzado con buen soporte.  Beba gran cantidad de agua mientras hace ejercicios para evitar la deshidratacin o los golpes de Airline pilotcalor. Durante la actividad fsica se pierde agua corporal que se debe reponer.  Haga ejercicio hasta que se acelere su respiracin y sus latidos cardacos. Esta informacin no tiene Theme park managercomo fin reemplazar el consejo del mdico. Asegrese de hacerle al mdico cualquier pregunta que tenga. Document Released: 05/06/2010 Document Revised: 02/20/2014 Document Reviewed: 07/03/2013 Elsevier Interactive Patient Education  [employer]2018 Elsevier Inc.

## 2017-05-10 LAB — MICROALBUMIN / CREATININE URINE RATIO
Creatinine, Urine: 37 mg/dL
MICROALBUM., U, RANDOM: 16.5 ug/mL
Microalb/Creat Ratio: 44.6 mg/g creat — ABNORMAL HIGH (ref 0.0–30.0)

## 2017-05-10 LAB — TSH: TSH: 2.49 u[IU]/mL (ref 0.450–4.500)

## 2017-05-13 ENCOUNTER — Other Ambulatory Visit: Payer: Self-pay | Admitting: Nurse Practitioner

## 2017-05-13 MED ORDER — LISINOPRIL 5 MG PO TABS: 5.0000 mg | ORAL_TABLET | Freq: Every day | ORAL | 3 refills | Status: DC

## 2017-05-15 ENCOUNTER — Telehealth: Payer: Self-pay

## 2017-05-15 NOTE — Telephone Encounter (Signed)
-----   Message from Claiborne RiggZelda W Fleming, NP sent at 05/13/2017  7:14 PM EDT ----- Thyroid levels are normal. Will recheck in 6 months. I am sending a prescription for a medication that will help protect your kidneys from diabetes. Your urine shows that your diabetes is having a negative affect on your kidneys.

## 2017-05-15 NOTE — Telephone Encounter (Signed)
CMA attempt to call patient.  No answer and unable to leave a VM due to no VM has been set up.  Letter will be send out to patient.   If patient call please inform: Thyroid levels are normal. Will recheck in 6 months. I am sending a prescription for a medication that will help protect your kidneys from diabetes. Your urine shows that your diabetes is having a negative affect on your kidneys.

## 2017-06-11 MED FILL — ?METFORMIN HCL 1,000 MG TAB: 1000 | 30 days supply | Qty: 60 | Fill #1

## 2017-06-11 MED FILL — ?ATORVASTATIN 40MG TABLET: 40 | 30 days supply | Qty: 30 | Fill #1

## 2017-06-11 MED FILL — LEVOTHYROXINE 75 MCG TABLET: 75 | 30 days supply | Qty: 30 | Fill #1

## 2017-07-12 MED FILL — ?METFORMIN HCL 1,000 MG TAB: 1000 | 30 days supply | Qty: 60 | Fill #2

## 2017-07-12 MED FILL — ?ATORVASTATIN 40MG TABLET: 40 | 30 days supply | Qty: 30 | Fill #2

## 2017-07-12 MED FILL — LEVOTHYROXINE 75 MCG TABLET: 75 | 30 days supply | Qty: 30 | Fill #2

## 2017-08-10 ENCOUNTER — Other Ambulatory Visit: Payer: Self-pay | Admitting: Nurse Practitioner

## 2017-08-10 ENCOUNTER — Encounter: Payer: Self-pay | Admitting: Nurse Practitioner

## 2017-08-10 ENCOUNTER — Ambulatory Visit: Payer: Self-pay | Attending: Nurse Practitioner | Admitting: Nurse Practitioner

## 2017-08-10 VITALS — BP 149/83 | HR 77 | Temp 98.6°F | Ht 63.0 in | Wt 227.0 lb

## 2017-08-10 DIAGNOSIS — Z7984 Long term (current) use of oral hypoglycemic drugs: Secondary | ICD-10-CM | POA: Insufficient documentation

## 2017-08-10 DIAGNOSIS — E782 Mixed hyperlipidemia: Secondary | ICD-10-CM | POA: Insufficient documentation

## 2017-08-10 DIAGNOSIS — E039 Hypothyroidism, unspecified: Secondary | ICD-10-CM | POA: Insufficient documentation

## 2017-08-10 DIAGNOSIS — E0865 Diabetes mellitus due to underlying condition with hyperglycemia: Secondary | ICD-10-CM

## 2017-08-10 DIAGNOSIS — Z79899 Other long term (current) drug therapy: Secondary | ICD-10-CM | POA: Insufficient documentation

## 2017-08-10 DIAGNOSIS — E1165 Type 2 diabetes mellitus with hyperglycemia: Secondary | ICD-10-CM | POA: Insufficient documentation

## 2017-08-10 DIAGNOSIS — Z79890 Hormone replacement therapy (postmenopausal): Secondary | ICD-10-CM | POA: Insufficient documentation

## 2017-08-10 LAB — POCT GLYCOSYLATED HEMOGLOBIN (HGB A1C): HEMOGLOBIN A1C: 7.1 % — AB (ref 4.0–5.6)

## 2017-08-10 LAB — GLUCOSE, POCT (MANUAL RESULT ENTRY): POC GLUCOSE: 154 mg/dL — AB (ref 70–99)

## 2017-08-10 MED ORDER — METFORMIN HCL 1000 MG PO TABS: 1000.0000 mg | ORAL_TABLET | Freq: Two times a day (BID) | ORAL | 3 refills | Status: DC

## 2017-08-10 MED ORDER — ATORVASTATIN CALCIUM 40 MG PO TABS: 40.0000 mg | ORAL_TABLET | Freq: Every day | ORAL | 0 refills | Status: DC

## 2017-08-10 MED ORDER — LISINOPRIL 5 MG PO TABS: 5.0000 mg | ORAL_TABLET | Freq: Every day | ORAL | 3 refills | Status: DC

## 2017-08-10 MED ORDER — LEVOTHYROXINE SODIUM 75 MCG PO TABS: 75.0000 ug | ORAL_TABLET | Freq: Every day | ORAL | 0 refills | Status: DC

## 2017-08-10 MED FILL — ATORVASTATIN CALCIUM 40 MG: 40 | 30 days supply | Qty: 30 | Fill #0

## 2017-08-10 MED FILL — LISINOPRIL 5 MG TAB: 5 | 30 days supply | Qty: 30 | Fill #0

## 2017-08-10 MED FILL — LEVOTHYROXINE 75 MCG TABLET: 75 | 30 days supply | Qty: 30 | Fill #0

## 2017-08-10 MED FILL — metFORMIN HCL 1000 MG TABS: 1000 | 30 days supply | Qty: 60 | Fill #0

## 2017-08-10 NOTE — Patient Instructions (Signed)
Diabetes mellitus y nutrición  Diabetes Mellitus and Nutrition  Si sufre de diabetes (diabetes mellitus), es muy importante tener hábitos alimenticios saludables debido a que sus niveles de azúcar en la sangre (glucosa) se ven afectados en gran medida por lo que come y bebe. [place] alimentos saludables en las cantidades adecuadas, aproximadamente a la misma hora todos los días, lo ayudará a:  · Controlar la glucemia.  · Disminuir el riesgo de sufrir una enfermedad cardíaca.  · Mejorar la presión arterial.  · Alcanzar o mantener un peso saludable.    Todas las personas que sufren de diabetes son diferentes y cada una tiene necesidades diferentes en cuanto a un plan de alimentación. El médico puede recomendarle que trabaje con un especialista en dietas y nutrición (nutricionista) para elaborar el mejor plan para usted. Su plan de alimentación puede variar según factores como:  · Las calorías que necesita.  · Los medicamentos que toma.  · Su peso.  · Sus niveles de glucemia, presión arterial y colesterol.  · Su nivel de actividad.  · Otras afecciones que tenga, como enfermedades cardíacas o renales.    ¿Cómo me afectan los carbohidratos?  Los carbohidratos afectan el nivel de glucemia más que cualquier otro tipo de alimento. La ingesta de carbohidratos naturalmente aumenta la cantidad glucosa en la sangre. El recuento de carbohidratos es un método destinado a llevar un registro de la cantidad de carbohidratos que se ingieren. El recuento de carbohidratos es importante para mantener la glucemia a un nivel saludable, en especial si utiliza insulina o toma determinados medicamentos por vía oral para la diabetes.  Es importante saber la cantidad de carbohidratos que se pueden ingerir en cada comida sin correr ningún riesgo. Esto es diferente en cada persona. El nutricionista puede ayudarlo a calcular la cantidad de carbohidratos que debe ingerir en cada comida y colación.   Los alimentos que contienen carbohidratos incluyen:  · Pan, cereal, arroz, pasta y galletas.  · Papas y maíz.  · Guisantes, frijoles y lentejas.  · Leche y yogur.  · Frutas y jugo.  · Postres, como pasteles, galletitas, helado y caramelos.    ¿Cómo me afecta el alcohol?  El alcohol puede provocar disminuciones súbitas de la glucemia (hipoglucemia), en especial si utiliza insulina o toma determinados medicamentos por vía oral para la diabetes. La hipoglucemia es una afección potencialmente mortal. Los síntomas de la hipoglucemia (somnolencia, mareos y confusión) son similares a los síntomas de haber consumido demasiado alcohol.  Si el médico afirma que el alcohol es seguro para usted, siga estas pautas:  · Limite el consumo de alcohol a no más de 1 medida por día si es mujer y no está embarazada, y a 2 medidas si es hombre. Una medida equivale a 12 oz (355 ml) de cerveza, 5 oz (148 ml) de vino o 1½ oz (44 ml) de bebidas de alta graduación alcohólica.  · No beba con el estómago vacío.  · Manténgase hidratado con agua, gaseosas dietéticas o té helado sin azúcar.  · Tenga en cuenta que las gaseosas comunes, los jugos y otros refrescos pueden contener mucha azúcar y se deben contar como carbohidratos.    Consejos para seguir este plan  Leer las etiquetas de los alimentos  · Comience por controlar el tamaño de la porción en la etiqueta. La cantidad de calorías, carbohidratos, grasas y otros nutrientes mencionados en la etiqueta se basan en una porción del alimento. Muchos alimentos contienen más de una porción por envase.  · Verifique la cantidad total de gramos (g)   de carbohidratos totales en una porción. Puede calcular la cantidad de porciones de carbohidratos al dividir el total de carbohidratos por 15. Por ejemplo, si un alimento posee un total de 30 g de carbohidratos, equivale a 2 porciones de carbohidratos.  · Verifique la cantidad de gramos (g) de grasas saturadas y grasas trans  en una porción. Escoja alimentos que no contengan grasa o que tengan un bajo contenido.  · Controle la cantidad de miligramos (mg) de sodio en una porción. La mayoría de las personas deben limitar la ingesta de sodio total a menos de 2300 mg por día.  · Siempre consulte la información nutricional de los alimentos etiquetados como “con bajo contenido de grasa” o “sin grasa”. Estos alimentos pueden ser más altos en azúcar agregada o en carbohidratos refinados y deben evitarse.  · Hable con el nutricionista para identificar sus objetivos diarios en cuanto a los nutrientes mencionados en la etiqueta.  De compras  · Evite comprar alimentos procesados, enlatados o prehechos. Estos alimentos tienden a tener mayor cantidad de grasa, sodio y azúcar agregada.  · Compre en la zona exterior de la tienda de comestibles. Esta incluye frutas y vegetales frescos, granos a granel, carnes frescas y productos lácteos frescos.  Cocción  · Utilice métodos de cocción a baja temperatura, como hornear, en lugar de métodos de cocción a alta temperatura, como freír en abundante aceite.  · Cocine con aceites saludables, como el aceite de oliva, canola o girasol.  · Evite cocinar con manteca, crema o carnes con alto contenido de grasa.  Planificación de las comidas  · Consuma las comidas y las colaciones de forma regular, preferentemente a la misma hora todos los días. Evite pasar largos períodos de tiempo sin comer.  · Consuma alimentos ricos en fibra, como frutas frescas, verduras, frijoles y cereales integrales. Consulte al nutricionista sobre cuántas porciones de carbohidratos puede consumir en cada comida.  · Consuma entre 4 y 6 onzas de proteínas magras por día, como carnes magras, pollo, pescado, huevos o tofu. 1 onza equivale a 1 onza de carne, pollo o pescado, 1 huevo, o 1/4 taza de tofu.  · Coma algunos alimentos por día que contengan grasas saludables, como aguacates, frutos secos, semillas y pescado.  Estilo de vida     · Controle su nivel de glucemia con regularidad.  · Haga ejercicio al menos 30 minutos, 5 días o más por semana, o como se lo haya indicado el médico.  · Tome los medicamentos como se lo haya indicado el médico.  · No consuma ningún producto que contenga nicotina o tabaco, como cigarrillos y cigarrillos electrónicos. Si necesita ayuda para dejar de fumar, consulte al médico.  · Trabaje con un asesor o instructor en diabetes para identificar estrategias para controlar el estrés y cualquier desafío emocional y social.  ¿Cuáles son algunas de las preguntas que puedo hacerle a mi médico?  · ¿Es necesario que me reúna con un instructor en diabetes?  · ¿Es necesario que me reúna con un nutricionista?  · ¿A qué número puedo llamar si tengo preguntas?  · ¿Cuáles son los mejores momentos para controlar la glucemia?  Dónde encontrar más información:  · Asociación Americana de la Diabetes (American Diabetes Association): diabetes.org/food-and-fitness/food  · Academia de Nutrición y Dietética (Academy of Nutrition and Dietetics): www.eatright.org/resources/health/diseases-and-conditions/diabetes  · Instituto Nacional de la Diabetes y las Enfermedades Digestivas y Renales (National Institute of Diabetes and Digestive and Kidney Diseases) (Institutos Nacionales de Salud, NIH): www.niddk.nih.gov/health-information/diabetes/overview/diet-eating-physical-activity  Resumen  · Un plan de alimentación saludable   lo ayudará a controlar la glucemia y mantener un estilo de vida saludable.  · Trabajar con un especialista en dietas y nutrición (nutricionista) puede ayudarlo a elaborar el mejor plan de alimentación para usted.  · Tenga en cuenta que los carbohidratos y el alcohol tienen efectos inmediatos en sus niveles de glucemia. Es importante contar los carbohidratos y consumir alcohol con prudencia.  Esta información no tiene como fin reemplazar el consejo del médico. Asegúrese de hacerle al médico cualquier pregunta que tenga.   Document Released: 05/09/2007 Document Revised: 05/22/2016 Document Reviewed: 05/22/2016  Elsevier Interactive Patient Education © 2018 Elsevier Inc.

## 2017-08-10 NOTE — Progress Notes (Addendum)
Assessment & Plan:  Kristin Wall was seen today for follow-up.  Diagnoses and all orders for this visit:  Diabetes mellitus due to underlying condition with hyperglycemia, without long-term current use of insulin (HCC) -     Glucose (CBG) -     HgB A1c -     metFORMIN (GLUCOPHAGE) 1000 MG tablet; Take 1 tablet (1,000 mg total) by mouth 2 (two) times daily with a meal. Continue blood sugar control as discussed in office today, low carbohydrate diet, and regular physical exercise as tolerated, 150 minutes per week (30 min each day, 5 days per week, or 50 min 3 days per week). Keep blood sugar logs with fasting goal of 80-130 mg/dl, post prandial less than 180.  For Hypoglycemia: BS <60 and Hyperglycemia BS >400; contact the clinic ASAP. Annual eye exams and foot exams are recommended.   Mixed hyperlipidemia -     atorvastatin (LIPITOR) 40 MG tablet; Take 1 tablet (40 mg total) by mouth daily. INSTRUCTIONS: Work on a low fat, heart healthy diet and participate in regular aerobic exercise program by working out at least 150 minutes per week. No fried foods. No junk foods, sodas, sugary drinks, unhealthy snacking, alcohol or smoking.     Hypothyroidism, unspecified type -     levothyroxine (SYNTHROID, LEVOTHROID) 75 MCG tablet; Take 1 tablet (75 mcg total) by mouth daily before breakfast.    Patient has been counseled on age-appropriate routine health concerns for screening and prevention. These are reviewed and up-to-date. Referrals have been placed accordingly. Immunizations are up-to-date or declined.    Subjective:   Chief Complaint  Patient presents with  . Follow-up    Pt. is here to follow-up on diabetes.    HPI Kristin Wall 37 y.o. female presents to office today for follow up to Hypothyroidism, DM and HPL.  Type 2 Diabetes Mellitus Disease course has been are improving. There are no hypoglycemic symptoms. There are no hypoglycemic complications. Symptoms are stable. There  are no diabetic complications. Risk factors for coronary artery disease include family history, dyslipidemia, diabetes mellitus, obesity, hypertension, sedentary lifestyle and stress. Current diabetic treatment includes metformin 1000 mg BID. Patient is compliant with treatment all of the time and monitors blood glucose at home 3 times per week.   Home blood glucose trend : (FBS 150s  Mg/dl; Postprandial 150s mg/dl )  Weight is  stable. Patient follows a generally healthy diet. Meal planning includes avoidance of concentrated sweets. Patient has not seen a dietician. Patient is not compliant with exercise.   An ACE inhibitor/angiotensin II receptor blocker is being taken. Patient does not see a podiatrist. Eye exam is not current.   Lab Results  Component Value Date   HGBA1C 7.1 (A) 08/10/2017   Lab Results  Component Value Date   HGBA1C 7.5 (A) 05/09/2017   Hyperlipidemia Patient presents for follow up to hyperlipidemia.  She is medication compliant with taking atorvastatin '40mg'$  daily. She is not diet compliant and denies chest pain, dyspnea, lower extremity edema and skin xanthelasma or statin intolerance including myalgias.  Lab Results  Component Value Date   CHOL 187 09/07/2016   Lab Results  Component Value Date   HDL 44 09/07/2016   Lab Results  Component Value Date   LDLCALC 110 (H) 09/07/2016   Lab Results  Component Value Date   TRIG 167 (H) 09/07/2016   Lab Results  Component Value Date   CHOLHDL 4.3 09/07/2016   Hypothyroidism Patient presents for evaluation of  thyroid function. Symptoms consist of denies fatigue, weight changes, heat/cold intolerance, bowel/skin changes or CVS symptoms.  Previous thyroid studies include TSH. The hypothyroidism is due to hypothyroidism.  Review of Systems  Constitutional: Negative for fever, malaise/fatigue and weight loss.  HENT: Negative.  Negative for nosebleeds.   Eyes: Negative.  Negative for blurred vision, double vision and  photophobia.  Respiratory: Negative.  Negative for cough and shortness of breath.   Cardiovascular: Negative.  Negative for chest pain, palpitations and leg swelling.  Gastrointestinal: Negative.  Negative for heartburn, nausea and vomiting.  Musculoskeletal: Negative.  Negative for myalgias.  Neurological: Negative.  Negative for dizziness, focal weakness, seizures and headaches.  Psychiatric/Behavioral: Negative.  Negative for suicidal ideas.    Past Medical History:  Diagnosis Date  . Amenorrhea   . Diabetes mellitus without complication ([place])   . Thyroid disease     Past Surgical History:  Procedure Laterality Date  . WISDOM TOOTH EXTRACTION      Family History  Problem Relation Age of Onset  . Diabetes Father   . Cancer Paternal Grandmother     Social History Reviewed with no changes to be made today.   Outpatient Medications Prior to Visit  Medication Sig Dispense Refill  . Blood Glucose Monitoring Suppl (TRUE METRIX GO GLUCOSE METER) w/Device KIT 1 each by Does not apply route every 8 (eight) hours as needed. 1 kit 0  . glucose blood test strip Use as instructed 100 each 12  . TRUEPLUS LANCETS 26G MISC 1 each by Does not apply route every 8 (eight) hours as needed. 100 each 12  . atorvastatin (LIPITOR) 40 MG tablet Take 1 tablet (40 mg total) by mouth daily. 90 tablet 0  . levothyroxine (SYNTHROID, LEVOTHROID) 75 MCG tablet Take 1 tablet (75 mcg total) by mouth daily before breakfast. 90 tablet 0  . metFORMIN (GLUCOPHAGE) 1000 MG tablet Take 1 tablet (1,000 mg total) by mouth 2 (two) times daily with a meal. 180 tablet 3  . lisinopril (PRINIVIL,ZESTRIL) 5 MG tablet Take 1 tablet (5 mg total) by mouth daily. (Patient not taking: Reported on 08/10/2017) 90 tablet 3   No facility-administered medications prior to visit.     No Known Allergies     Objective:    BP (!) 149/83 (BP Location: Left Arm, Patient Position: Sitting, Cuff Size: Normal)   Pulse 77   Temp 98.6  F (37 C) (Oral)   Ht '5\' 3"'$  (1.6 m)   Wt 227 lb (103 kg)   SpO2 96%   BMI 40.21 kg/m  Wt Readings from Last 3 Encounters:  08/10/17 227 lb (103 kg)  05/09/17 226 lb 9.6 oz (102.8 kg)  01/11/17 223 lb 12.8 oz (101.5 kg)    Physical Exam  Constitutional: She is oriented to person, place, and time. She appears well-developed and well-nourished. She is cooperative.  HENT:  Head: Normocephalic and atraumatic.  Eyes: EOM are normal.  Neck: Normal range of motion.  Cardiovascular: Normal rate, regular rhythm, normal heart sounds and intact distal pulses. Exam reveals no gallop and no friction rub.  No murmur heard. Pulses:      Dorsalis pedis pulses are 1+ on the right side, and 1+ on the left side.       Posterior tibial pulses are 1+ on the right side, and 1+ on the left side.  Pulmonary/Chest: Effort normal and breath sounds normal. No tachypnea. No respiratory distress. She has no decreased breath sounds. She has no wheezes. She  has no rhonchi. She has no rales. She exhibits no tenderness.  Abdominal: Bowel sounds are normal.  Musculoskeletal: Normal range of motion. She exhibits no edema.  Feet:  Right Foot:  Protective Sensation: 10 sites tested. 10 sites sensed.  Skin Integrity: Negative for skin breakdown.  Left Foot:  Protective Sensation: 10 sites tested. 10 sites sensed.  Skin Integrity: Negative for skin breakdown.  Neurological: She is alert and oriented to person, place, and time. Coordination normal.  Skin: Skin is warm and dry.  Psychiatric: She has a normal mood and affect. Her behavior is normal. Judgment and thought content normal.  Nursing note and vitals reviewed.      Patient has been counseled extensively about nutrition and exercise as well as the importance of adherence with medications and regular follow-up. The patient was given clear instructions to go to ER or return to medical center if symptoms don't improve, worsen or new problems develop. The patient  verbalized understanding.   Follow-up: Return in about 3 months (around 11/10/2017) for /HPL/DM.   Gildardo Pounds, FNP-BC [facility] and [place], [place]   08/10/2017, 8:59 AM

## 2017-10-05 MED FILL — ATORVASTATIN CALCIUM 40 MG: 40 | 30 days supply | Qty: 30 | Fill #1

## 2017-10-05 MED FILL — LEVOTHYROXINE 75 MCG TABLET: 75 | 30 days supply | Qty: 30 | Fill #1

## 2017-10-05 MED FILL — LISINOPRIL 5 MG TAB: 5 | 30 days supply | Qty: 30 | Fill #1

## 2017-10-05 MED FILL — metFORMIN HCL 1000 MG TABS: 1000 | 30 days supply | Qty: 60 | Fill #1

## 2017-10-10 ENCOUNTER — Ambulatory Visit: Payer: Self-pay | Attending: Family Medicine

## 2017-11-12 ENCOUNTER — Ambulatory Visit: Payer: Self-pay | Admitting: Nurse Practitioner

## 2017-12-12 ENCOUNTER — Ambulatory Visit: Payer: Self-pay | Attending: Nurse Practitioner | Admitting: Nurse Practitioner

## 2017-12-12 ENCOUNTER — Encounter: Payer: Self-pay | Admitting: Nurse Practitioner

## 2017-12-12 VITALS — BP 125/78 | HR 71 | Temp 98.6°F | Ht 63.0 in | Wt 221.6 lb

## 2017-12-12 DIAGNOSIS — Z23 Encounter for immunization: Secondary | ICD-10-CM

## 2017-12-12 DIAGNOSIS — E0865 Diabetes mellitus due to underlying condition with hyperglycemia: Secondary | ICD-10-CM

## 2017-12-12 DIAGNOSIS — E039 Hypothyroidism, unspecified: Secondary | ICD-10-CM

## 2017-12-12 DIAGNOSIS — E1165 Type 2 diabetes mellitus with hyperglycemia: Secondary | ICD-10-CM | POA: Insufficient documentation

## 2017-12-12 DIAGNOSIS — Z79890 Hormone replacement therapy (postmenopausal): Secondary | ICD-10-CM | POA: Insufficient documentation

## 2017-12-12 DIAGNOSIS — Z79899 Other long term (current) drug therapy: Secondary | ICD-10-CM | POA: Insufficient documentation

## 2017-12-12 DIAGNOSIS — E782 Mixed hyperlipidemia: Secondary | ICD-10-CM

## 2017-12-12 DIAGNOSIS — Z7984 Long term (current) use of oral hypoglycemic drugs: Secondary | ICD-10-CM | POA: Insufficient documentation

## 2017-12-12 DIAGNOSIS — Z833 Family history of diabetes mellitus: Secondary | ICD-10-CM | POA: Insufficient documentation

## 2017-12-12 LAB — POCT GLYCOSYLATED HEMOGLOBIN (HGB A1C): HEMOGLOBIN A1C: 7.3 % — AB (ref 4.0–5.6)

## 2017-12-12 LAB — GLUCOSE, POCT (MANUAL RESULT ENTRY): POC Glucose: 133 mg/dl — AB (ref 70–99)

## 2017-12-12 MED ORDER — LISINOPRIL 5 MG PO TABS: 5.0000 mg | ORAL_TABLET | Freq: Every day | ORAL | 3 refills | Status: DC

## 2017-12-12 MED ORDER — ATORVASTATIN CALCIUM 40 MG PO TABS: 40.0000 mg | ORAL_TABLET | Freq: Every day | ORAL | 0 refills | Status: DC

## 2017-12-12 MED ORDER — LEVOTHYROXINE SODIUM 75 MCG PO TABS: 75.0000 ug | ORAL_TABLET | Freq: Every day | ORAL | 0 refills | Status: DC

## 2017-12-12 MED ORDER — SITAGLIPTIN PHOS-METFORMIN HCL 50-1000 MG PO TABS: 1.0000 | ORAL_TABLET | Freq: Two times a day (BID) | ORAL | 6 refills | Status: DC

## 2017-12-12 MED FILL — ?ATORVASTATIN 40MG TABLET: 40 | 30 days supply | Qty: 30 | Fill #0

## 2017-12-12 MED FILL — JANUMET 50-1,000 MG TABLET: 50-1000 | 30 days supply | Qty: 60 | Fill #0

## 2017-12-12 MED FILL — LISINOPRIL 5 MG TABLET: 5 | 30 days supply | Qty: 30 | Fill #0

## 2017-12-12 MED FILL — LEVOTHYROXINE 75 MCG TABLET: 75 | 30 days supply | Qty: 30 | Fill #0

## 2017-12-12 NOTE — Patient Instructions (Signed)
Diabetes blood sugar goals  Fasting in AM before breakfast which means at least 8 hrs of no eating or drinking) except water or unsweetened coffee or tea):  Blood sugars should be 90-110   2 hrs after meals: < 160,   Hypoglycemia or low blood sugar: < 70 (You should not have hypoglycemia.)  Aim for 30 minutes of exercise most days. Rethink what you drink. Water is great! Aim for 2-3 Carb Choices per meal (30-45 grams) +/- 1 either way  Aim for 0-15 Carbs per snack if hungry  Include protein in moderation with your meals and snacks  Consider reading food labels for Total Carbohydrate and Fat Grams of foods  Consider checking BG at alternate times per day  Continue taking medication as directed Be mindful about how much sugar you are adding to beverages and other foods. Fruit Punch - find one with no sugar  Measure and decrease portions of carbohydrate foods  Make your plate and don't go back for secondsd.

## 2017-12-12 NOTE — Progress Notes (Signed)
Assessment & Plan:  Kristin Wall was seen today for follow-up.  Diagnoses and all orders for this visit:  Diabetes mellitus due to underlying condition with hyperglycemia, without long-term current use of insulin (HCC) -     Glucose (CBG) -     HgB A1c -     sitaGLIPtin-metformin (JANUMET) 50-1000 MG tablet; Take 1 tablet by mouth 2 (two) times daily with a meal. -     lisinopril (PRINIVIL,ZESTRIL) 5 MG tablet; Take 1 tablet (5 mg total) by mouth daily. Continue blood sugar control as discussed in office today, low carbohydrate diet, and regular physical exercise as tolerated, 150 minutes per week (30 min each day, 5 days per week, or 50 min 3 days per week). Keep blood sugar logs with fasting goal of 90-130 mg/dl, post prandial (after you eat) less than 180.  For Hypoglycemia: BS <60 and Hyperglycemia BS >400; contact the clinic ASAP. Annual eye exams and foot exams are recommended.   Hypothyroidism, unspecified type -     TSH -     levothyroxine (SYNTHROID, LEVOTHROID) 75 MCG tablet; Take 1 tablet (75 mcg total) by mouth daily before breakfast.  Mixed hyperlipidemia -     Lipid panel -     atorvastatin (LIPITOR) 40 MG tablet; Take 1 tablet (40 mg total) by mouth daily. INSTRUCTIONS: Work on a low fat, heart healthy diet and participate in regular aerobic exercise program by working out at least 150 minutes per week; 5 days a week-30 minutes per day. Avoid red meat, fried foods. junk foods, sodas, sugary drinks, unhealthy snacking, alcohol and smoking.  Drink at least 48oz of water per day and monitor your carbohydrate intake daily.    Patient has been counseled on age-appropriate routine health concerns for screening and prevention. These are reviewed and up-to-date. Referrals have been placed accordingly. Immunizations are up-to-date or declined.    Subjective:   Chief Complaint  Patient presents with  . Follow-up    Pt. is here to follow-up on diabetes.    HPI Kristin Wall 37  y.o. female presents to office today for follow up to DM, HPL, and Hypothyroidism. VRI was used to communicate directly with patient for the entire encounter including providing detailed patient instructions.    Type 2 Diabetes Mellitus Disease course has been fairly stable. There are no hypoglycemic symptoms. There are no hypoglycemic complications. Symptoms are stable. There are not diabetic complications. Risk factors for coronary artery disease include family history, dyslipidemia, diabetes mellitus, obesity, hypertension, sedentary lifestyle and stress. Current diabetic treatment includes metformin 1000 mg BID. A1c not at goal. Will switch to Janumet. Patient is compliant with treatment all of the time and monitors blood glucose at home 2 times per day.   Home blood glucose trend : (FBS 150 mg/dl)  Weight is stable and she has lost 6 lbs. There is some lack of knowledge regarding dietary intake in regards to DM. We discussed dietary modifications today in regards to high carb foods. Meal planning includes avoidance of concentrated sweets. Patient has not seen a dietician. Patient is not compliant with exercise.   An ACE inhibitor/angiotensin II receptor blocker is being taken. Patient does not see a podiatrist. Eye exam is not current. Patient has been advised to apply for financial assistance and schedule to see our financial counselor.   Lab Results  Component Value Date   HGBA1C 7.3 (A) 12/12/2017   Lab Results  Component Value Date   HGBA1C 7.1 (A) 08/10/2017  Hyperlipidemia Patient presents for follow up to hyperlipidemia.  She is medication compliant. She is not diet compliant and denies chest pain, exertional chest pressure/discomfort, poor exercise tolerance and skin xanthelasma or statin intolerance including myalgias. LDL not at goal.  Lab Results  Component Value Date   CHOL 187 09/07/2016   Lab Results  Component Value Date   HDL 44 09/07/2016   Lab Results  Component Value  Date   LDLCALC 110 (H) 09/07/2016   Lab Results  Component Value Date   TRIG 167 (H) 09/07/2016   Lab Results  Component Value Date   CHOLHDL 4.3 09/07/2016    Hypothyroidism Kristin Wall is a 37 y.o. female who presents for follow up of hypothyroidism. Current symptoms: none . Patient denies change in energy level, diarrhea, heat / cold intolerance, nervousness, palpitations and weight changes. Symptoms have stabilized.    Review of Systems  Constitutional: Negative for fever, malaise/fatigue and weight loss.  HENT: Negative.  Negative for nosebleeds.   Eyes: Negative.  Negative for blurred vision, double vision and photophobia.  Respiratory: Negative.  Negative for cough and shortness of breath.   Cardiovascular: Negative.  Negative for chest pain, palpitations and leg swelling.  Gastrointestinal: Negative.  Negative for heartburn, nausea and vomiting.  Musculoskeletal: Negative.  Negative for myalgias.  Neurological: Negative.  Negative for dizziness, focal weakness, seizures and headaches.  Psychiatric/Behavioral: Negative.  Negative for suicidal ideas.    Past Medical History:  Diagnosis Date  . Amenorrhea   . Diabetes mellitus without complication ([place])   . Thyroid disease     Past Surgical History:  Procedure Laterality Date  . WISDOM TOOTH EXTRACTION      Family History  Problem Relation Age of Onset  . Diabetes Father   . Cancer Paternal Grandmother     Social History Reviewed with no changes to be made today.   Outpatient Medications Prior to Visit  Medication Sig Dispense Refill  . Blood Glucose Monitoring Suppl (TRUE METRIX GO GLUCOSE METER) w/Device KIT 1 each by Does not apply route every 8 (eight) hours as needed. 1 kit 0  . glucose blood test strip Use as instructed 100 each 12  . TRUEPLUS LANCETS 26G MISC 1 each by Does not apply route every 8 (eight) hours as needed. 100 each 12  . atorvastatin (LIPITOR) 40 MG tablet Take 1 tablet (40 mg  total) by mouth daily. 90 tablet 0  . levothyroxine (SYNTHROID, LEVOTHROID) 75 MCG tablet Take 1 tablet (75 mcg total) by mouth daily before breakfast. 90 tablet 0  . lisinopril (PRINIVIL,ZESTRIL) 5 MG tablet Take 1 tablet (5 mg total) by mouth daily. 90 tablet 3  . metFORMIN (GLUCOPHAGE) 1000 MG tablet Take 1 tablet (1,000 mg total) by mouth 2 (two) times daily with a meal. 180 tablet 3   No facility-administered medications prior to visit.     No Known Allergies     Objective:    BP 125/78 (BP Location: Left Arm, Patient Position: Sitting, Cuff Size: Normal)   Pulse 71   Temp 98.6 F (37 C) (Oral)   Ht '5\' 3"'$  (1.6 m)   Wt 221 lb 9.6 oz (100.5 kg)   SpO2 96%   BMI 39.25 kg/m  Wt Readings from Last 3 Encounters:  12/12/17 221 lb 9.6 oz (100.5 kg)  08/10/17 227 lb (103 kg)  05/09/17 226 lb 9.6 oz (102.8 kg)    Physical Exam  Constitutional: She is oriented to person, place, and time. She  appears well-developed and well-nourished. She is cooperative.  HENT:  Head: Normocephalic and atraumatic.  Eyes: EOM are normal.  Neck: Normal range of motion.  Cardiovascular: Normal rate, regular rhythm, normal heart sounds and intact distal pulses. Exam reveals no gallop and no friction rub.  No murmur heard. Pulmonary/Chest: Effort normal and breath sounds normal. No tachypnea. No respiratory distress. She has no decreased breath sounds. She has no wheezes. She has no rhonchi. She has no rales. She exhibits no tenderness.  Abdominal: Soft. Bowel sounds are normal.  Musculoskeletal: Normal range of motion. She exhibits no edema.  Neurological: She is alert and oriented to person, place, and time. Coordination normal.  Skin: Skin is warm and dry.  Psychiatric: She has a normal mood and affect. Her behavior is normal. Judgment and thought content normal.  Nursing note and vitals reviewed.        Patient has been counseled extensively about nutrition and exercise as well as the  importance of adherence with medications and regular follow-up. The patient was given clear instructions to go to ER or return to medical center if symptoms don't improve, worsen or new problems develop. The patient verbalized understanding.   Follow-up: Return in about 3 months (around 03/14/2018) for HPL/DM.   Gildardo Pounds, FNP-BC [facility] and [place], [place]   12/12/2017, 9:27 AM

## 2017-12-13 LAB — LIPID PANEL
CHOL/HDL RATIO: 2.9 ratio (ref 0.0–4.4)
Cholesterol, Total: 130 mg/dL (ref 100–199)
HDL: 45 mg/dL (ref >39)
LDL CALC: 63 mg/dL (ref 0–99)
TRIGLYCERIDES: 108 mg/dL (ref 0–149)
VLDL Cholesterol Cal: 22 mg/dL (ref 5–40)

## 2017-12-13 LAB — TSH: TSH: 1.62 u[IU]/mL (ref 0.450–4.500)

## 2017-12-18 ENCOUNTER — Telehealth: Payer: Self-pay

## 2017-12-18 NOTE — Telephone Encounter (Signed)
-----   Message from Claiborne Rigg, NP sent at 12/13/2017  9:34 PM EDT ----- Thyroid level is normal. Continue same dose of thyroid medication. Cholesterol levels are all normal. Continue lipitor at this time.

## 2017-12-18 NOTE — Telephone Encounter (Signed)
CMA attempt to reach patient to inform on results.  No answer and unable to leave a VM due to no mailbox is available to set up.  A letter will be send out to reach patient.  If patient call back, please inform:  Thyroid level is normal. Continue same dose of thyroid medication. Cholesterol levels are all normal. Continue lipitor at this time.

## 2018-02-15 MED FILL — LEVOTHYROXINE 75 MCG TABLET: 75 | 30 days supply | Qty: 30 | Fill #1

## 2018-02-15 MED FILL — LISINOPRIL 5 MG TAB: 5 | 30 days supply | Qty: 30 | Fill #1

## 2018-02-15 MED FILL — ?ATORVASTATIN 40MG TABLET: 40 | 30 days supply | Qty: 30 | Fill #1

## 2018-02-15 MED FILL — JANUMET 50-1,000 MG TABLET: 50-1000 | 30 days supply | Qty: 60 | Fill #1

## 2018-03-18 ENCOUNTER — Ambulatory Visit: Payer: Self-pay | Admitting: Nurse Practitioner

## 2023-10-23 ENCOUNTER — Encounter (HOSPITAL_COMMUNITY): Payer: Self-pay

## 2023-10-23 ENCOUNTER — Other Ambulatory Visit: Payer: Self-pay

## 2023-10-23 ENCOUNTER — Inpatient Hospital Stay (HOSPITAL_COMMUNITY)
Admission: EM | Admit: 2023-10-23 | Discharge: 2023-10-25 | DRG: 805 | Disposition: A | Discharge status: Home / Self Care | Payer: MEDICAID | Attending: Obstetrics and Gynecology | Admitting: Obstetrics and Gynecology

## 2023-10-23 DIAGNOSIS — O134 Gestational [pregnancy-induced] hypertension without significant proteinuria, complicating childbirth: Secondary | ICD-10-CM

## 2023-10-23 DIAGNOSIS — Z603 Acculturation difficulty: Secondary | ICD-10-CM | POA: Diagnosis present

## 2023-10-23 DIAGNOSIS — Z79899 Other long term (current) drug therapy: Secondary | ICD-10-CM

## 2023-10-23 DIAGNOSIS — O99214 Obesity complicating childbirth: Secondary | ICD-10-CM | POA: Diagnosis present

## 2023-10-23 DIAGNOSIS — Z7984 Long term (current) use of oral hypoglycemic drugs: Secondary | ICD-10-CM

## 2023-10-23 DIAGNOSIS — E119 Type 2 diabetes mellitus without complications: Secondary | ICD-10-CM | POA: Diagnosis present

## 2023-10-23 DIAGNOSIS — Z79890 Hormone replacement therapy (postmenopausal): Secondary | ICD-10-CM | POA: Diagnosis not present

## 2023-10-23 DIAGNOSIS — O0933 Supervision of pregnancy with insufficient antenatal care, third trimester: Secondary | ICD-10-CM

## 2023-10-23 DIAGNOSIS — O2412 Pre-existing diabetes mellitus, type 2, in childbirth: Secondary | ICD-10-CM | POA: Diagnosis present

## 2023-10-23 DIAGNOSIS — O24424 Gestational diabetes mellitus in childbirth, insulin controlled: Secondary | ICD-10-CM

## 2023-10-23 DIAGNOSIS — Z3A00 Weeks of gestation of pregnancy not specified: Secondary | ICD-10-CM | POA: Diagnosis not present

## 2023-10-23 DIAGNOSIS — Z833 Family history of diabetes mellitus: Secondary | ICD-10-CM

## 2023-10-23 DIAGNOSIS — R109 Unspecified abdominal pain: Secondary | ICD-10-CM | POA: Diagnosis present

## 2023-10-23 LAB — HCG, SERUM, QUALITATIVE: Preg, Serum: POSITIVE — AB

## 2023-10-23 LAB — CBC
HCT: 42.6 % (ref 36.0–46.0)
Hemoglobin: 14.1 g/dL (ref 12.0–15.0)
MCH: 28 pg (ref 26.0–34.0)
MCHC: 33.1 g/dL (ref 30.0–36.0)
MCV: 84.5 fL (ref 80.0–100.0)
Platelets: 261 K/uL (ref 150–400)
RBC: 5.04 MIL/uL (ref 3.87–5.11)
RDW: 13.8 % (ref 11.5–15.5)
WBC: 12.8 K/uL — ABNORMAL HIGH (ref 4.0–10.5)
nRBC: 0 % (ref 0.0–0.2)

## 2023-10-23 LAB — LIPASE, BLOOD: Lipase: 13 U/L (ref 11–51)

## 2023-10-23 LAB — COMPREHENSIVE METABOLIC PANEL WITH GFR
ALT: 28 U/L (ref 0–44)
AST: 38 U/L (ref 15–41)
Albumin: 3.8 g/dL (ref 3.5–5.0)
Alkaline Phosphatase: 232 U/L — ABNORMAL HIGH (ref 38–126)
Anion gap: 25 — ABNORMAL HIGH (ref 5–15)
BUN: 15 mg/dL (ref 6–20)
CO2: 12 mmol/L — ABNORMAL LOW (ref 22–32)
Calcium: 9.8 mg/dL (ref 8.9–10.3)
Chloride: 97 mmol/L — ABNORMAL LOW (ref 98–111)
Creatinine, Ser: 0.77 mg/dL (ref 0.44–1.00)
GFR, Estimated: >60 mL/min (ref >60)
Glucose, Bld: 240 mg/dL — ABNORMAL HIGH (ref 70–99)
Potassium: 4 mmol/L (ref 3.5–5.1)
Sodium: 134 mmol/L — ABNORMAL LOW (ref 135–145)
Total Bilirubin: 0.5 mg/dL (ref 0.0–1.2)
Total Protein: 8 g/dL (ref 6.5–8.1)

## 2023-10-23 MED ORDER — IBUPROFEN 600 MG PO TABS
600.0000 mg | ORAL_TABLET | Freq: Four times a day (QID) | ORAL | Status: DC
  Administered 2023-10-23 – 2023-10-25 (×5): 600 mg via ORAL
  Filled 2023-10-23 (×6): qty 1

## 2023-10-23 MED ORDER — ONDANSETRON HCL 4 MG/2ML IJ SOLN: 4.0000 mg | INTRAMUSCULAR | Status: DC | PRN

## 2023-10-23 MED ORDER — BENZOCAINE-MENTHOL 20-0.5 % EX AERO: 1.0000 | INHALATION_SPRAY | CUTANEOUS | Status: DC | PRN

## 2023-10-23 MED ORDER — DIPHENHYDRAMINE HCL 25 MG PO CAPS: 25.0000 mg | ORAL_CAPSULE | Freq: Four times a day (QID) | ORAL | Status: DC | PRN

## 2023-10-23 MED ORDER — PRENATAL MULTIVITAMIN CH
1.0000 | ORAL_TABLET | Freq: Every day | ORAL | Status: DC
  Administered 2023-10-24 – 2023-10-25 (×2): 1 via ORAL
  Filled 2023-10-23 (×2): qty 1

## 2023-10-23 MED ORDER — SENNOSIDES-DOCUSATE SODIUM 8.6-50 MG PO TABS
2.0000 | ORAL_TABLET | Freq: Every day | ORAL | Status: DC
  Administered 2023-10-24 – 2023-10-25 (×2): 2 via ORAL
  Filled 2023-10-23 (×2): qty 2

## 2023-10-23 MED ORDER — COCONUT OIL OIL: 1.0000 | TOPICAL_OIL | Status: DC | PRN

## 2023-10-23 MED ORDER — ZOLPIDEM TARTRATE 5 MG PO TABS: 5.0000 mg | ORAL_TABLET | Freq: Every evening | ORAL | Status: DC | PRN

## 2023-10-23 MED ORDER — OXYCODONE HCL 5 MG PO TABS: 10.0000 mg | ORAL_TABLET | ORAL | Status: DC | PRN

## 2023-10-23 MED ORDER — ACETAMINOPHEN 325 MG PO TABS
650.0000 mg | ORAL_TABLET | ORAL | Status: DC | PRN
  Administered 2023-10-24: 650 mg via ORAL
  Filled 2023-10-23: qty 2

## 2023-10-23 MED ORDER — SIMETHICONE 80 MG PO CHEW: 80.0000 mg | CHEWABLE_TABLET | ORAL | Status: DC | PRN

## 2023-10-23 MED ORDER — OXYCODONE HCL 5 MG PO TABS: 5.0000 mg | ORAL_TABLET | ORAL | Status: DC | PRN

## 2023-10-23 MED ORDER — TETANUS-DIPHTH-ACELL PERTUSSIS 5-2.5-18.5 LF-MCG/0.5 IM SUSY: 0.5000 mL | PREFILLED_SYRINGE | Freq: Once | INTRAMUSCULAR | Status: DC

## 2023-10-23 MED ORDER — DIBUCAINE (PERIANAL) 1 % EX OINT: 1.0000 | TOPICAL_OINTMENT | CUTANEOUS | Status: DC | PRN

## 2023-10-23 MED ORDER — ONDANSETRON HCL 4 MG PO TABS: 4.0000 mg | ORAL_TABLET | ORAL | Status: DC | PRN

## 2023-10-23 MED ORDER — NIFEDIPINE ER OSMOTIC RELEASE 30 MG PO TB24
30.0000 mg | ORAL_TABLET | Freq: Every day | ORAL | Status: DC
  Administered 2023-10-24: 30 mg via ORAL
  Filled 2023-10-23: qty 1

## 2023-10-23 MED ORDER — WITCH HAZEL-GLYCERIN EX PADS: 1.0000 | MEDICATED_PAD | CUTANEOUS | Status: DC | PRN

## 2023-10-23 NOTE — ED Provider Notes (Signed)
 OB Delivery  Date/Time: 10/23/2023 9:55 PM  Performed by: Lenor Hollering, MD Authorized by: Lenor Hollering, MD   Consent Done?:  Emergent Situation Delivery Summary For::  Mother Procedure Details - Mother:    Presentation::  Occiput anterior   Amniotic sac:  Leaking membranes   Amniotic fluid:  Clear   Shoulder dystocia:  Delivery of posterior arm   Episiotomy:  None   Laceration::  None   Cord Complication::  Nuchal   Number of loops::  1   Cord Around::  Head   Cord delayed clamping:: No     Cord blood sent:: No     Child Living status::  Yes   Placenta Delivered::  Yes   Placenta Removal::  Spontaneous   Placenta Appearance::  Intact   Vaginal Packing::  None     Lenor Hollering, MD 10/23/23 [id number]    Lenor Hollering, MD 10/25/23 1358

## 2023-10-23 NOTE — ED Provider Notes (Signed)
 [place] EMERGENCY DEPARTMENT AT [facility] Provider Note   CSN: [id number] Arrival date & time: 10/23/23  1724     Patient presents with: Abdominal Pain   Franziska Podgurski is a 43 y.o. female G1 P1 Ab0 who presents to the emergency room today for further evaluation of lower abdominal pain.  Pain started this morning.  She is also having associated urge to defecate and urge to urinate.  She states that something is coming out of my vagina.  Her last menstrual period was back in January.  She is normally very regular.  She does not believe she is pregnant.  She does note some scant vaginal bleeding and vaginal discharge.  Denies any nausea, vomiting, diarrhea, fever, chills.    Abdominal Pain      Prior to Admission medications   Medication Sig Start Date End Date Taking? Authorizing Provider  atorvastatin  (LIPITOR) 40 MG tablet Take 1 tablet (40 mg total) by mouth daily. 12/12/17   Rozell Theiler, Zelda W, NP  Blood Glucose Monitoring Suppl (TRUE METRIX GO GLUCOSE METER) w/Device KIT 1 each by Does not apply route every 8 (eight) hours as needed. 01/20/16   Marilyne Morrison T, MD  glucose blood test strip Use as instructed 09/07/16   Durenda Alston SAUNDERS, FNP  levothyroxine  (SYNTHROID , LEVOTHROID) 75 MCG tablet Take 1 tablet (75 mcg total) by mouth daily before breakfast. 12/12/17   Alexyss Balzarini, Zelda W, NP  lisinopril  (PRINIVIL ,ZESTRIL ) 5 MG tablet Take 1 tablet (5 mg total) by mouth daily. 12/12/17   Dereke Neumann, Zelda W, NP  sitaGLIPtin -metformin  (JANUMET ) 50-1000 MG tablet Take 1 tablet by mouth 2 (two) times daily with a meal. 12/12/17   Theotis Haze ORN, NP  TRUEPLUS LANCETS 26G MISC 1 each by Does not apply route every 8 (eight) hours as needed. 09/07/16   Durenda Alston SAUNDERS, FNP    Allergies: Patient has no known allergies.    Review of Systems  Gastrointestinal:  Positive for abdominal pain.  All other systems reviewed and are negative.   Updated Vital Signs BP (!)  155/93 (BP Location: Right Arm)   Pulse (!) 115   Temp 98.6 F (37 C) (Oral)   Resp 20   Ht 5' 3 (1.6 m)   Wt 95.3 kg   SpO2 98%   BMI 37.20 kg/m   Physical Exam Vitals and nursing note reviewed.  Constitutional:      General: She is in acute distress.     Appearance: Normal appearance.  HENT:     Head: Normocephalic and atraumatic.  Eyes:     General:        Right eye: No discharge.        Left eye: No discharge.  Cardiovascular:     Rate and Rhythm: Tachycardia present.     Comments: S1/S2 are distinct without any evidence of murmur, rubs, or gallops.  Radial pulses are 2+ bilaterally.  Dorsalis pedis pulses are 2+ bilaterally.  No evidence of pedal edema. Pulmonary:     Effort: Tachypnea present.  Abdominal:     General: Abdomen is flat. Bowel sounds are normal. There is no distension.     Tenderness: There is no abdominal tenderness. There is no guarding or rebound.  Musculoskeletal:        General: Normal range of motion.     Cervical back: Neck supple.  Skin:    General: Skin is warm and dry.     Findings: No rash.  Neurological:  General: No focal deficit present.     Mental Status: She is alert.  Psychiatric:        Mood and Affect: Mood normal.        Behavior: Behavior normal.     (all labs ordered are listed, but only abnormal results are displayed) Labs Reviewed  COMPREHENSIVE METABOLIC PANEL WITH GFR - Abnormal; Notable for the following components:      Result Value   Sodium 134 (*)    Chloride 97 (*)    CO2 12 (*)    Glucose, Bld 240 (*)    Alkaline Phosphatase 232 (*)    Anion gap 25 (*)    All other components within normal limits  CBC - Abnormal; Notable for the following components:   WBC 12.8 (*)    All other components within normal limits  HCG, SERUM, QUALITATIVE - Abnormal; Notable for the following components:   Preg, Serum POSITIVE (*)    All other components within normal limits  LIPASE, BLOOD  URINALYSIS, ROUTINE W REFLEX  MICROSCOPIC    EKG: None  Radiology: No results found.   Ultrasound ED OB Pelvic  Date/Time: 10/23/2023 10:03 PM  Performed by: Theotis Cameron HERO, PA-C Authorized by: Theotis Cameron HERO, PA-C   Procedure details:    Indications: pregnant with abdominal pain     Assess:  Fetal viability and intrauterine pregnancy   Technique:  Transabdominal obstetric (HCG+) exam   Images: not archived   Study Limitations: body habitus Uterine findings:    Intrauterine pregnancy: identified     Single gestation: identified      Left ovary findings:    Left ovary:  Not visualized    Right ovary findings:     Right ovary:  Not visualized       Medications Ordered in the ED - No data to display   Medical Decision Making Elan Mcelvain is a 43 y.o. female patient who presents to the emergency department today for further evaluation of lower abdominal pain.  Patient was having episodic abdominal pain pretty consistent episodes apart.  She states that the pain comes on every 12 or so minutes.  I promptly grabbed an ultrasound probe to look for any fetal signs in the uterus as the patient's early qualitative hCG was positive.  This puts her at approximately 6 months by dates per patient.  Promptly notified attending who evaluated patient at bedside.  Prior to this, I performed a sterile manual examination and did feel fetus in the vaginal canal.  Questionable whether or not patient was cephalic or in breech position.  I promptly called OB rapid response and then talked to the on-call OB/GYN provider.  OB nurse came and evaluated patient at bedside and successfully delivered term healthy fetus.  Patient stable at this time.  She will be transferred MAU for further evaluation and management.   Amount and/or Complexity of Data Reviewed Labs: ordered. Decision-making details documented in ED Course.     Final diagnoses:  Precipitous delivery, delivered (current hospitalization)    ED  Discharge Orders     None          Theotis Cameron HERO, [place] 10/23/23 2204    Lenor Hollering, MD 10/24/23 1409

## 2023-10-23 NOTE — H&P (Signed)
 OBSTETRIC ADMISSION HISTORY AND PHYSICAL  Kristin Wall is a 43 y.o. female G2P2 PPD0 s/p SVD at [facility] ED c/b shoulder dystocia managed with delivery of posterior arm, here for postpartum care & recovery.   Presented to [facility] ED for abdominal pain, found to be pregnant in labor and completely dilated. RROB RN present for delivery of infant & placenta. No PPH. Patient did not know she was pregnant. LMP Jan 2025.   Here, pt confirms the above history. She denies pain. No HVB. Reports hx tSVD x 1 prior to this. Has hx T2DM and thyroid disease but reports she has not seen a doctor in a long time and is not on any current medications. Denies HA, visual changes, CP/SOB, RUQ/epigastric pain  She is surprised about her pregnancy but she and her partner state they are happy to have a baby. She plans on formula & breast feeding. She is unsure for birth control.  Past Medical History: Past Medical History:  Diagnosis Date   Amenorrhea    Diabetes mellitus without complication (HCC)    Thyroid disease     Past Surgical History: Past Surgical History:  Procedure Laterality Date   WISDOM TOOTH EXTRACTION      Obstetrical History: OB History     Gravida  2   Para  2   Term  1   Preterm      AB      Living  2      SAB      IAB      Ectopic      Multiple  0   Live Births  2           Social History Social History   Socioeconomic History   Marital status: Single    Spouse name: Not on file   Number of children: Not on file   Years of education: Not on file   Highest education level: Not on file  Occupational History   Not on file  Tobacco Use   Smoking status: Never   Smokeless tobacco: Never  Vaping Use   Vaping status: Never Used  Substance and Sexual Activity   Alcohol use: Not Currently    Comment: occasionally   Drug use: No   Sexual activity: Yes    Birth control/protection: None  Other Topics Concern   Not on file  Social History Narrative   Not  on file   Social Drivers of Health   Financial Resource Strain: Not on file  Food Insecurity: Not on file  Transportation Needs: Not on file  Physical Activity: Not on file  Stress: Not on file  Social Connections: Not on file    Family History: Family History  Problem Relation Age of Onset   Diabetes Father    Cancer Paternal Grandmother     Allergies: No Known Allergies  Medications Prior to Admission  Medication Sig Dispense Refill Last Dose/Taking   atorvastatin  (LIPITOR) 40 MG tablet Take 1 tablet (40 mg total) by mouth daily. 90 tablet 0    Blood Glucose Monitoring Suppl (TRUE METRIX GO GLUCOSE METER) w/Device KIT 1 each by Does not apply route every 8 (eight) hours as needed. 1 kit 0    glucose blood test strip Use as instructed 100 each 12    levothyroxine  (SYNTHROID , LEVOTHROID) 75 MCG tablet Take 1 tablet (75 mcg total) by mouth daily before breakfast. 90 tablet 0    lisinopril  (PRINIVIL ,ZESTRIL ) 5 MG tablet Take 1 tablet (5 mg  total) by mouth daily. 90 tablet 3    sitaGLIPtin -metformin  (JANUMET ) 50-1000 MG tablet Take 1 tablet by mouth 2 (two) times daily with a meal. 60 tablet 6    TRUEPLUS LANCETS 26G MISC 1 each by Does not apply route every 8 (eight) hours as needed. 100 each 12    Review of Systems   All systems reviewed and negative except as stated in HPI  Blood pressure (!) 153/82, pulse 83, temperature 98.1 F (36.7 C), temperature source Oral, resp. rate 18, height 5' 3 (1.6 m), weight 95.3 kg, SpO2 98%, unknown if currently breastfeeding. General appearance: alert, cooperative, and no distress Lungs: normal effort Heart: normal rate Abdomen: soft, non-tender; fundus U+1 Pelvic: No e/o perineal laceration, lochia appropriate. RN present for exam.  Extremities: no sign of DVT  Prenatal labs: Ordered  Prenatal Transfer Tool  Maternal Diabetes: T2DM Genetic Screening: N/A Maternal Ultrasounds/Referrals: None Fetal Ultrasounds or other Referrals:   None Maternal Substance Abuse:  No Significant Maternal Medications:  None Significant Maternal Lab Results:  None Number of Prenatal Visits:Less than or equal to 3 verified prenatal visits Other Comments:  None  Results for orders placed or performed during the hospital encounter of 10/23/23 (from the past 24 hours)  Lipase, blood   Collection Time: 10/23/23  6:02 PM  Result Value Ref Range   Lipase 13 11 - 51 U/L  Comprehensive metabolic panel   Collection Time: 10/23/23  6:02 PM  Result Value Ref Range   Sodium 134 (L) 135 - 145 mmol/L   Potassium 4.0 3.5 - 5.1 mmol/L   Chloride 97 (L) 98 - 111 mmol/L   CO2 12 (L) 22 - 32 mmol/L   Glucose, Bld 240 (H) 70 - 99 mg/dL   BUN 15 6 - 20 mg/dL   Creatinine, Ser 9.22 0.44 - 1.00 mg/dL   Calcium  9.8 8.9 - 10.3 mg/dL   Total Protein 8.0 6.5 - 8.1 g/dL   Albumin 3.8 3.5 - 5.0 g/dL   AST 38 15 - 41 U/L   ALT 28 0 - 44 U/L   Alkaline Phosphatase 232 (H) 38 - 126 U/L   Total Bilirubin 0.5 0.0 - 1.2 mg/dL   GFR, Estimated >39 >39 mL/min   Anion gap 25 (H) 5 - 15  CBC   Collection Time: 10/23/23  6:02 PM  Result Value Ref Range   WBC 12.8 (H) 4.0 - 10.5 K/uL   RBC 5.04 3.87 - 5.11 MIL/uL   Hemoglobin 14.1 12.0 - 15.0 g/dL   HCT 57.3 63.9 - 53.9 %   MCV 84.5 80.0 - 100.0 fL   MCH 28.0 26.0 - 34.0 pg   MCHC 33.1 30.0 - 36.0 g/dL   RDW 86.1 88.4 - 84.4 %   Platelets 261 150 - 400 K/uL   nRBC 0.0 0.0 - 0.2 %  hCG, serum, qualitative   Collection Time: 10/23/23  6:02 PM  Result Value Ref Range   Preg, Serum POSITIVE (A) NEGATIVE    Patient Active Problem List   Diagnosis Date Noted   SVD (spontaneous vaginal delivery) 10/23/2023   Morbid obesity (HCC) 05/09/2017   Type 2 diabetes mellitus without complication, without long-term current use of insulin (HCC) 01/11/2017   Dyslipidemia 01/04/2015   Vitamin D  deficiency 01/02/2014   Thyroid activity decreased 01/02/2014   Family history of diabetes mellitus (DM) 01/02/2014    Diabetes mellitus due to underlying condition with hyperglycemia (HCC) 01/02/2014   Secondary amenorrhea 05/21/2013   Hypothyroidism  05/21/2013    Assessment/Plan:  Adeleine Wall is a 43 y.o. G2P1002 PPD0 s/p SVD presenting for postpartum recovery/care  #Postpartum - routine PP orders entered  #Elevated blood pressure - Denies hx HTN, but does have rx for lisinopril  (not taking) - No preE sxs, labs normal - Start procardia  XL 30mg  daily, lasix /K  #T2DM - No current meds - glucose fasting, preprandial & at bedtime for baseline - Can likely dc CBGs after 24-48h and initiate meds prn  #. Hx thyroid disease - F/u TSH  #. No PNC - standard prenatal labs ordered including rapid HIV - SW consult  Kieth JAYSON Carolin, MD  10/24/2023, 12:48 AM

## 2023-10-23 NOTE — ED Triage Notes (Addendum)
 Patient has had lower middle abdominal pain and has not been able to control her urine that began today. No vomiting or diarrhea. Complaining of lower middle abdominal pain that moves to her lower middle back. Stated she had blood in her urine today.

## 2023-10-23 NOTE — Progress Notes (Signed)
 2022: OBRRN called to [facility] for pt unknown gestation with abdominal pain. Manual exam done by ED MD states head feels like its in the vagina.   2024: Dr. Erik on unit and notified of potential precipitous delivery at [facility].  2032: OBRRN at bedside, monitors adjusted.   2036: 10/100/+2- pt involuntarily pushing.  2037: Dr. Erik called and notified of pt 10/100/+2- pt involuntarily pushing. Unknown gestation, no PNC.   2047: SVD of VMI placed to maternal abdomen complicated by one min long shoulder dystocia. McRoberts unsuccessful so delivered posterior arm. Infant with apgars of 6/9, poor color, respiratory effort, and tone. Dried, stimulated, and bulb suctioned at perineum. Cord clamped x2 and cut at approximately one minute of life. Infant to warmer at bedside with strong cry and improvement in color and tone. Gentle traction to cord and push from pt delivered placenta at 2055. Uterus palpated firm, perineum appears intact.   2153: Kristin Wall, MB Charge called and notified of pt. Infant must transport separately and will come first.  2157: Ulanda Artis, RN called for report of pt now G2P2. Unknown gestation, no PNC. Pt reports DM- not checking sugars recently, no other medical history. Bp's elevated without any in severe range. SVD at 2047 complicated by one min long shoulder dystocia. McRoberts unsuccessful so delivered posterior arm. Infant with apgars of 6/9. Infant VSS now. F/f at one U-U/1 for me, small bleeding. Pt plans to bottle feed.   2200: [facility] at bedside for transport.

## 2023-10-23 NOTE — ED Provider Triage Note (Signed)
 Emergency Medicine Provider Triage Evaluation Note  Kristin Wall , a 43 y.o. female  was evaluated in triage. Use of spanish interpreter [phone]. Pt complains of suprapubic abdominal pain, onset this morning. She endorses frequent urination, and noted blood on toilet paper when wiping. Nauseated, one episode of vomiting this morning that was clear. Denies fever, chills. Denies vaginal discharge.  Review of Systems  Positive: Suprapubic pain, urinary frequencies Negative: Fever, chills, vaginal discharge  Physical Exam  BP (!) 187/118   Pulse (!) 130   Temp 97.9 F (36.6 C) (Oral)   Resp (!) 24   Ht 5' 3 (1.6 m)   Wt 95.3 kg   SpO2 100%   BMI 37.20 kg/m  Gen:   Awake, appears uncomfortable Resp:  Normal effort  MSK:   Moves extremities without difficulty  Other:  TTP in suprabpubic area. Abdomen soft  Medical Decision Making  Medically screening exam initiated at 5:44 PM.  Appropriate orders placed.  Kristin Wall was informed that the remainder of the evaluation will be completed by another provider, this initial triage assessment does not replace that evaluation, and the importance of remaining in the ED until their evaluation is complete.     Claudene Lenis, NP 10/23/23 [phone]

## 2023-10-24 ENCOUNTER — Encounter (HOSPITAL_COMMUNITY): Payer: Self-pay | Admitting: Obstetrics and Gynecology

## 2023-10-24 LAB — TYPE AND SCREEN
ABO/RH(D): O POS
Antibody Screen: NEGATIVE

## 2023-10-24 LAB — CBC
HCT: 33.5 % — ABNORMAL LOW (ref 36.0–46.0)
Hemoglobin: 11.7 g/dL — ABNORMAL LOW (ref 12.0–15.0)
MCH: 28.9 pg (ref 26.0–34.0)
MCHC: 34.9 g/dL (ref 30.0–36.0)
MCV: 82.7 fL (ref 80.0–100.0)
Platelets: 220 K/uL (ref 150–400)
RBC: 4.05 MIL/uL (ref 3.87–5.11)
RDW: 13.7 % (ref 11.5–15.5)
WBC: 14.9 K/uL — ABNORMAL HIGH (ref 4.0–10.5)
nRBC: 0 % (ref 0.0–0.2)

## 2023-10-24 LAB — COMPREHENSIVE METABOLIC PANEL WITH GFR
ALT: 26 U/L (ref 0–44)
AST: 38 U/L (ref 15–41)
Albumin: 2.5 g/dL — ABNORMAL LOW (ref 3.5–5.0)
Alkaline Phosphatase: 143 U/L — ABNORMAL HIGH (ref 38–126)
Anion gap: 10 (ref 5–15)
BUN: 9 mg/dL (ref 6–20)
CO2: 21 mmol/L — ABNORMAL LOW (ref 22–32)
Calcium: 8.8 mg/dL — ABNORMAL LOW (ref 8.9–10.3)
Chloride: 104 mmol/L (ref 98–111)
Creatinine, Ser: 0.67 mg/dL (ref 0.44–1.00)
GFR, Estimated: >60 mL/min (ref >60)
Glucose, Bld: 187 mg/dL — ABNORMAL HIGH (ref 70–99)
Potassium: 4.1 mmol/L (ref 3.5–5.1)
Sodium: 135 mmol/L (ref 135–145)
Total Bilirubin: 0.5 mg/dL (ref 0.0–1.2)
Total Protein: 6.3 g/dL — ABNORMAL LOW (ref 6.5–8.1)

## 2023-10-24 LAB — TSH: TSH: 2.004 u[IU]/mL (ref 0.350–4.500)

## 2023-10-24 LAB — GLUCOSE, CAPILLARY
Glucose-Capillary: 174 mg/dL — ABNORMAL HIGH (ref 70–99)
Glucose-Capillary: 129 mg/dL — ABNORMAL HIGH (ref 70–99)
Glucose-Capillary: 135 mg/dL — ABNORMAL HIGH (ref 70–99)
Glucose-Capillary: 149 mg/dL — ABNORMAL HIGH (ref 70–99)
Glucose-Capillary: 86 mg/dL (ref 70–99)

## 2023-10-24 LAB — RAPID HIV SCREEN (HIV 1/2 AB+AG)
HIV 1/2 Antibodies: NONREACTIVE
HIV-1 P24 Antigen - HIV24: NONREACTIVE

## 2023-10-24 LAB — HEPATITIS B SURFACE ANTIGEN: Hepatitis B Surface Ag: NONREACTIVE

## 2023-10-24 LAB — RPR: RPR Ser Ql: NONREACTIVE

## 2023-10-24 MED ORDER — AMLODIPINE BESYLATE 5 MG PO TABS
5.0000 mg | ORAL_TABLET | Freq: Once | ORAL | Status: AC
  Administered 2023-10-24: 5 mg via ORAL
  Filled 2023-10-24: qty 1

## 2023-10-24 MED ORDER — NIFEDIPINE ER OSMOTIC RELEASE 30 MG PO TB24: 30.0000 mg | ORAL_TABLET | Freq: Every day | ORAL | Status: DC

## 2023-10-24 MED ORDER — AMLODIPINE BESYLATE 5 MG PO TABS
5.0000 mg | ORAL_TABLET | Freq: Every day | ORAL | Status: DC
  Administered 2023-10-24: 5 mg via ORAL
  Filled 2023-10-24: qty 1

## 2023-10-24 MED ORDER — OXYTOCIN-SODIUM CHLORIDE 30-0.9 UT/500ML-% IV SOLN: 2.5000 [IU]/h | INTRAVENOUS | Status: DC

## 2023-10-24 MED ORDER — METFORMIN HCL ER 500 MG PO TB24
500.0000 mg | ORAL_TABLET | Freq: Every day | ORAL | Status: DC
  Administered 2023-10-24: 500 mg via ORAL
  Filled 2023-10-24: qty 1

## 2023-10-24 MED ORDER — FUROSEMIDE 20 MG PO TABS
20.0000 mg | ORAL_TABLET | Freq: Every day | ORAL | Status: DC
  Administered 2023-10-24 – 2023-10-25 (×2): 20 mg via ORAL
  Filled 2023-10-24 (×2): qty 1

## 2023-10-24 MED ORDER — OXYTOCIN BOLUS FROM INFUSION
333.0000 mL | Freq: Once | INTRAVENOUS | Status: AC
  Administered 2023-10-23: 333 mL via INTRAVENOUS

## 2023-10-24 MED ORDER — POTASSIUM CHLORIDE CRYS ER 20 MEQ PO TBCR
20.0000 meq | EXTENDED_RELEASE_TABLET | Freq: Every day | ORAL | Status: DC
  Administered 2023-10-24 – 2023-10-25 (×2): 20 meq via ORAL
  Filled 2023-10-24 (×2): qty 1

## 2023-10-24 MED ORDER — AMLODIPINE BESYLATE 5 MG PO TABS
10.0000 mg | ORAL_TABLET | Freq: Every day | ORAL | Status: DC
  Administered 2023-10-25: 10 mg via ORAL
  Filled 2023-10-24: qty 2

## 2023-10-24 NOTE — Lactation Note (Signed)
 This note was copied from a baby's chart. Lactation Consultation Note  Patient Name: Boy Arcenia Scarbro Unijb'd Date: 10/24/2023 Age:42 hours  MOB decided she wants to formula feed only Per RN.    Maternal Data    Feeding Nipple Type: Slow - flow  LATCH Score                    Lactation Tools Discussed/Used    Interventions    Discharge    Consult Status Consult Status: Complete    Grayce LULLA Batter 10/24/2023, 12:43 AM

## 2023-10-24 NOTE — ED Notes (Addendum)
 [facility] OB Called. PA and EDP at bedside.

## 2023-10-24 NOTE — Progress Notes (Signed)
 Ordered pt lunch and snack, assisted RN with explanation of care by Marlena Loges Spanish Medical Interpreter.

## 2023-10-24 NOTE — Clinical Social Work Maternal (Signed)
 CLINICAL SOCIAL WORK MATERNAL/CHILD NOTE  Patient Details  Name: Kristin Wall MRN: [id number] Date of Birth: 10/02/1980  Date:  09/21/23  Clinical Social Worker Initiating Note:  Suzen Law, [place] Date/Time: Initiated:  12-Jul-2023/1342     Child's Name:  Kristin Wall   Biological Parents:  Mother, Father (Father: Kristin Wall)   Need for Interpreter:  Spanish Dino [phone])   Reason for Referral:  Parental Support of Premature Babies < 32 weeks/or Critically Ill babies, Late or No Prenatal Care     Address:  [address] [place] [address]    Phone number:  [phone] (home)     Additional phone number:   Household Members/Support Persons (HM/SP):   Household Member/Support Person 1, Household Member/Support Person 2   HM/SP Name Relationship DOB or Age  HM/SP -1 Kristin Wall FOB/Husband    HM/SP -2 Kristin Wall son 39 years old  HM/SP -3        HM/SP -4        HM/SP -5        HM/SP -6        HM/SP -7        HM/SP -8          Natural Supports (not living in the home):      Professional Supports: None   Employment: Unemployed   Type of Work:     Education:  Other (comment) (8th Grade)   Homebound arranged:    Surveyor, quantity Resources:  Self-Pay     Other Resources:      Cultural/Religious Considerations Which May Impact Care:    Strengths:  Ability to meet basic needs  , Pediatrician chosen, Understanding of illness   Psychotropic Medications:         Pediatrician:    [employer] operational [employer] area  Pediatrician List:   [facility] ([address] [employer])  [place]    [facility]      Pediatrician Fax Number:    Risk Factors/Current Problems:  None   Cognitive State:  Able to Concentrate  , Alert  , Goal Oriented  , Linear Thinking     Mood/Affect:  Calm  , Comfortable  , Euthymic  , Interested      CSW Assessment: CSW met with MOB at infant's bedside to complete psychosocial assessment. CSW utilized [employer] spanish video interpreter. CSW introduced self and explained role. MOB was pleasant and remained engaged during assessment. MOB reported that she resides with her husband and older son. MOB reported that she doesn't receive WIC as she was unaware of her pregnancy. MOB reported that she is interested in [facility]. CSW agreed to complete a [facility] referral, MOB agreed to referral. MOB reported that she doesn't have any baby items as she didn't know about her pregnancy. CSW informed MOB about [facility] [employer] if any assistance is needed obtaining items for infant, MOB reported that a referral for all items would be helpful. CSW agreed to complete a referral. CSW inquired about MOB's support system, MOB reported that her husband is a support. MOB shared that her husband is currently out shopping for a car seat.   CSW inquired about MOB's mental health history. MOB denied any mental health history. CSW inquired about how MOB was feeling emotionally since giving birth, MOB reported that she was feeling good. MOB presented calm and did  not demonstrate any acute mental health signs/symptoms. CSW assessed for safety, MOB denied SI, HI, and domestic violence.   CSW provided education regarding the baby blues period vs. perinatal mood disorders, discussed treatment and gave resources for mental health follow up if concerns arise.  CSW recommends self-evaluation during the postpartum time period using the New Mom Checklist from Postpartum Progress and encouraged MOB to contact a medical professional if symptoms are noted at any time.    CSW provided review of Sudden Infant Death Syndrome (SIDS) precautions.    CSW and MOB discussed infant's NICU admission. CSW informed MOB about the NICU, what to expect, and resources/supports available while infant is admitted to the NICU. MOB  reported that she feels informed about infant's care and denied any transportation barriers with visiting infant in the NICU. MOB denied any questions/concerns regarding the NICU.   CSW informed MOB about the hospital drug screen policy due to MOB having no prenatal care. MOB reported that she was unaware of her pregnancy and that's why she didn't have prenatal care. MOB denied any barriers with getting infant to the pediatrician. CSW informed MOB that infant's UDS and CDS would be monitored and a CPS report would be made if warranted. MOB denied substance use during pregnancy and denied any questions about the hospital drug screen policy.   CSW asked if there were any additional resources/supports that would be helpful, MOB reported no additional needs.   CSW completed a [facility] referral. CSW made a FSN referral for requested items.   CSW will continue to offer resources/supports while infant is admitted to the NICU as MOB opted for CSW to check in weekly.    CSW Plan/Description:  Sudden Infant Death Syndrome (SIDS) Education, Perinatal Mood and Anxiety Disorder (PMADs) Education, Psychosocial Support and Ongoing Assessment of Needs, Hospital Drug Screen Policy Information, CSW Will Continue to Monitor Umbilical Cord Tissue Drug Screen Results and Make Report if Warranted, Other Information/Referral to [facility], Other Patient/Family Education    [place], LCSW 2023/03/31, 1:50 PM

## 2023-10-24 NOTE — Progress Notes (Signed)
 MOB still in NICU with baby

## 2023-10-24 NOTE — Progress Notes (Signed)
 POSTPARTUM PROGRESS NOTE  Post Partum Day 1  Subjective:  Kristin Wall is a 43 y.o. G2P1002 s/p SVD at Unknown gestational age (pt not aware she was pregnant) LMP Jan 2025.  She reports she is doing well. No acute events overnight. She denies any problems with ambulating, voiding or po intake. Denies nausea or vomiting.  Pain is well controlled.  Lochia is Normal.  She continues to have elevated blood glucose and blood pressures.   Objective: Blood pressure (!) 141/80, pulse 87, temperature 98.7 F (37.1 C), temperature source Oral, resp. rate 16, height 5' 3 (1.6 m), weight 95.3 kg, SpO2 99%, unknown if currently breastfeeding.  BP Readings from Last 3 Encounters:  10/24/23 (!) 141/80  12/12/17 125/78  08/10/17 (!) 149/83    Physical Exam:  General: alert, cooperative and no distress Chest: no respiratory distress Heart:regular rate Uterine Fundus: firm Extremities: no edema Skin: warm, dry  Recent Labs    10/23/23 1802 10/24/23 0144  HGB 14.1 11.7*  HCT 42.6 33.5*    Assessment/Plan: Kristin Wall is a 43 y.o. G2P1002 s/p NSVD at Unknown gestational age (LMP Jan 2025)  PPD# 1 - Doing well  Routine postpartum care  Delivery Complications: chronic hypertension and diabetes mellitus  #cHTN: abnormal: elevated, start Lasix /KCl and amlodipine   #Anemia/Hb Status: stable  #Contraception: uncertain  #Feeding: breast feeding and bottle feeding  #DMII: start metformin  500mg  in the morning, discharge on 500mg  BID  Dispo: Plan for discharge tomorrow.   LOS: 1 day   Kristin KATHEE Pouch, MD OB Fellow  10/24/2023, 7:46 AM

## 2023-10-24 NOTE — Progress Notes (Signed)
 This nurse needs to check pt. Blood sugar, and vitals. Pt is in NICU with baby at this time.

## 2023-10-25 ENCOUNTER — Other Ambulatory Visit (HOSPITAL_COMMUNITY): Payer: Self-pay

## 2023-10-25 ENCOUNTER — Telehealth: Payer: Self-pay | Admitting: Family Medicine

## 2023-10-25 LAB — RUBELLA SCREEN: Rubella: 21.4 {index} (ref >0.99)

## 2023-10-25 LAB — URINALYSIS, ROUTINE W REFLEX MICROSCOPIC
Bilirubin Urine: NEGATIVE
Glucose, UA: NEGATIVE mg/dL
Ketones, ur: NEGATIVE mg/dL
Nitrite: NEGATIVE
Protein, ur: 30 mg/dL — AB
RBC / HPF: >50 RBC/hpf (ref 0–5)
Specific Gravity, Urine: 1.02 (ref 1.005–1.030)
pH: 5 (ref 5.0–8.0)

## 2023-10-25 LAB — GLUCOSE, CAPILLARY
Glucose-Capillary: 129 mg/dL — ABNORMAL HIGH (ref 70–99)
Glucose-Capillary: 95 mg/dL (ref 70–99)

## 2023-10-25 MED ORDER — POTASSIUM CHLORIDE CRYS ER 20 MEQ PO TBCR
20.0000 meq | EXTENDED_RELEASE_TABLET | Freq: Every day | ORAL | 0 refills | Status: DC
  Filled 2023-10-25: qty 3, 3d supply, fill #0

## 2023-10-25 MED ORDER — AMLODIPINE BESYLATE 10 MG PO TABS
10.0000 mg | ORAL_TABLET | Freq: Every day | ORAL | 0 refills | Status: DC
  Filled 2023-10-25: qty 30, 30d supply, fill #0

## 2023-10-25 MED ORDER — LIVING WELL WITH DIABETES BOOK - IN SPANISH
Freq: Once | Status: DC
  Filled 2023-10-25: qty 1

## 2023-10-25 MED ORDER — IBUPROFEN 600 MG PO TABS
600.0000 mg | ORAL_TABLET | Freq: Four times a day (QID) | ORAL | 0 refills | Status: DC
  Filled 2023-10-25: qty 30, 8d supply, fill #0

## 2023-10-25 MED ORDER — FUROSEMIDE 20 MG PO TABS
20.0000 mg | ORAL_TABLET | Freq: Every day | ORAL | 0 refills | Status: DC
  Filled 2023-10-25: qty 3, 3d supply, fill #0

## 2023-10-25 MED ORDER — ACETAMINOPHEN 500 MG PO TABS
1000.0000 mg | ORAL_TABLET | Freq: Four times a day (QID) | ORAL | 0 refills | Status: AC | PRN
  Filled 2023-10-25: qty 60, 8d supply, fill #0

## 2023-10-25 MED ORDER — METFORMIN HCL ER 500 MG PO TB24
ORAL_TABLET | ORAL | 0 refills | Status: AC
  Filled 2023-10-25: qty 60, 30d supply, fill #0

## 2023-10-25 MED ORDER — PRENATAL MULTIVITAMIN CH
1.0000 | ORAL_TABLET | Freq: Every day | ORAL | 1 refills | Status: DC
  Filled 2023-10-25: qty 30, 30d supply, fill #0

## 2023-10-25 MED ORDER — METFORMIN HCL ER 500 MG PO TB24
500.0000 mg | ORAL_TABLET | Freq: Two times a day (BID) | ORAL | 0 refills | Status: DC
  Filled 2023-10-25: qty 60, 30d supply, fill #0

## 2023-10-25 MED ORDER — LIVING WELL WITH DIABETES BOOK
Freq: Once | Status: DC
Start: 2023-10-25 — End: 2023-10-25
  Filled 2023-10-25 (×2): qty 1

## 2023-10-25 NOTE — Telephone Encounter (Signed)
 Called patient twice to provide her appointment details. She didn't answer the phone but I left her a voice message with her appointment information and our office address and contact number in case she needed to reschedule.

## 2023-10-25 NOTE — Discharge Summary (Signed)
 Postpartum Discharge Summary      Patient Name: Kristin Wall DOB: February 28, 1980 MRN: [id number]  Date of admission: 10/23/2023 Delivery date:10/23/2023 Delivering provider: LENOR HOLLERING Date of discharge: 10/25/2023  Admitting diagnosis: Precipitous delivery, delivered (current hospitalization) [O62.3] SVD (spontaneous vaginal delivery) [O80] Intrauterine pregnancy:  LMP Jan 2025    Secondary diagnosis:  Principal Problem:   SVD (spontaneous vaginal delivery) Active Problems:   Type 2 diabetes mellitus without complication, without long-term current use of insulin (HCC)   Morbid obesity (HCC)  Additional problems: cHTN vs PP HTN, no PNC (patient was not aware of pregnancy), slightly decreased hemoglobin   Discharge diagnosis: Pregnancy delivered, unknown term with T2DM and cHTN                                             Post partum procedures: none Augmentation: Unknown  Complications: None  Hospital course: Patient presented for postpartum recovery/care. Routine PP orders entered, and standard prenatal labs ordered due to no PNC. During course she has had elevated BP. Procardia  was started, switched to Norvasc  10 mg with lasix /K for BP control and diuresis, respectively. BG has been monitored and Metformin  500 mg BID has been started for T2DM.    Magnesium Sulfate received: No BMZ received: No Rhophylac:No MMR:No T-DaP:Given postpartum Flu: Yes RSV Vaccine received: No Transfusion:No  Immunizations received: Immunization History  Administered Date(s) Administered   Influenza,inj,Quad PF,6+ Mos 01/02/2014, 12/18/2014, 10/13/2015, 01/11/2017, 12/12/2017   Pneumococcal Polysaccharide-23 10/13/2015   Tdap 04/21/2015    Physical exam  Vitals:   10/24/23 1804 10/24/23 1932 10/25/23 0500 10/25/23 1123  BP: 130/79 137/81 131/89 (!) 142/86  Pulse:  68 74   Resp:  16 18   Temp:  98.2 F (36.8 C) 97.9 F (36.6 C)   TempSrc:   Oral   SpO2:  98% 98%   Weight:       Height:       General: alert, cooperative, and no distress Lochia: appropriate Uterine Fundus: firm Incision: N/A DVT Evaluation: No evidence of DVT seen on physical exam. No cords or calf tenderness. No significant calf/ankle edema. Labs: Lab Results  Component Value Date   WBC 14.9 (H) 10/24/2023   HGB 11.7 (L) 10/24/2023   HCT 33.5 (L) 10/24/2023   MCV 82.7 10/24/2023   PLT 220 10/24/2023      Latest Ref Rng & Units 10/24/2023    1:44 AM  CMP  Glucose 70 - 99 mg/dL 812   BUN 6 - 20 mg/dL 9   Creatinine 9.55 - 8.99 mg/dL 9.32   Sodium 864 - 854 mmol/L 135   Potassium 3.5 - 5.1 mmol/L 4.1   Chloride 98 - 111 mmol/L 104   CO2 22 - 32 mmol/L 21   Calcium  8.9 - 10.3 mg/dL 8.8   Total Protein 6.5 - 8.1 g/dL 6.3   Total Bilirubin 0.0 - 1.2 mg/dL 0.5   Alkaline Phos 38 - 126 U/L 143   AST 15 - 41 U/L 38   ALT 0 - 44 U/L 26    Edinburgh Score:    10/24/2023    7:31 PM  Edinburgh Postnatal Depression Scale Screening Tool  I have been able to laugh and see the funny side of things. 0  I have looked forward with enjoyment to things. 0  I have blamed myself unnecessarily when things  went wrong. 1  I have been anxious or worried for no good reason. 0  I have felt scared or panicky for no good reason. 0  Things have been getting on top of me. 1  I have been so unhappy that I have had difficulty sleeping. 0  I have felt sad or miserable. 0  I have been so unhappy that I have been crying. 0  The thought of harming myself has occurred to me. 0  Edinburgh Postnatal Depression Scale Total 2   Edinburgh Postnatal Depression Scale Total: 2   After visit meds:  Allergies as of 10/25/2023   No Known Allergies      Medication List     STOP taking these medications    atorvastatin  40 MG tablet Commonly known as: LIPITOR   lisinopril  5 MG tablet Commonly known as: ZESTRIL    sitaGLIPtin -metformin  50-1000 MG tablet Commonly known as: Janumet        TAKE these  medications    acetaminophen  500 MG tablet Commonly known as: TYLENOL  Take 2 tablets (1,000 mg total) by mouth every 6 (six) hours as needed for mild pain (pain score 1-3) or moderate pain (pain score 4-6).   amLODipine  10 MG tablet Commonly known as: NORVASC  Tome 1 tableta (10 mg en total) por va oral diariamente. (Take 1 tablet (10 mg total) by mouth daily.) Start taking on: October 26, 2023   furosemide  20 MG tablet Commonly known as: LASIX  Tome 1 tableta (20 mg en total) por va oral diariamente. (Take 1 tablet (20 mg total) by mouth daily.) Start taking on: October 26, 2023   glucose blood test strip Use as instructed   ibuprofen  600 MG tablet Commonly known as: ADVIL  Take 1 tablet (600 mg total) by mouth every 6 (six) hours.   levothyroxine  75 MCG tablet Commonly known as: SYNTHROID  Take 1 tablet (75 mcg total) by mouth daily before breakfast.   metFORMIN  500 MG 24 hr tablet Commonly known as: GLUCOPHAGE -XR Take 1 tablet (500 mg total) by mouth in the morning and at bedtime.   potassium chloride  SA 20 MEQ tablet Commonly known as: KLOR-CON  M Tome una tableta (20 mEq en total) por va oral diariamente. (Take 1 tablet (20 mEq total) by mouth daily.) Start taking on: October 26, 2023   prenatal multivitamin Tabs tablet Take 1 tablet by mouth daily at 12 noon.   True Metrix Go Glucose Meter w/Device Kit 1 each by Does not apply route every 8 (eight) hours as needed.   TRUEplus Lancets 26G Misc 1 each by Does not apply route every 8 (eight) hours as needed.         Discharge home in stable condition Infant Feeding: Bottle and Breast Infant Disposition:NICU Discharge instruction: per After Visit Summary and Postpartum booklet. Activity: Advance as tolerated. Pelvic rest for 6 weeks.  Diet: routine diet Future Appointments:No future appointments. Follow up Visit:   Sent [facility] 10/25/23 - [facility]  Please schedule this patient for a In person postpartum  visit in 4 weeks with the following provider: Any provider. Additional Postpartum F/U:BP check 1 week and 2 hour GTT in 4- 6 weeks   High risk pregnancy complicated by: GDM and HTN Delivery mode:  Vaginal, Spontaneous Anticipated Birth Control:  POPs   10/25/2023 Aries Townley LITTIE Angles, MD

## 2023-10-25 NOTE — Inpatient Diabetes Management (Signed)
 Inpatient Diabetes Program Recommendations  AACE/ADA: New Consensus Statement on Inpatient Glycemic Control (2015)  Target Ranges:  Prepandial:   less than 140 mg/dL      Peak postprandial:   less than 180 mg/dL (1-2 hours)      Critically ill patients:  140 - 180 mg/dL   Lab Results  Component Value Date   GLUCAP 129 (H) 10/25/2023   HGBA1C 7.3 (A) 12/12/2017    Review of Glycemic Control  Latest Reference Range & Units 10/24/23 07:39 10/24/23 15:41 10/24/23 18:40 10/24/23 21:47 10/25/23 07:54  Glucose-Capillary 70 - 99 mg/dL 870 (H) 864 (H) 850 (H) 86 129 (H)  (H): Data is abnormally high  Diabetes history: DM2 Outpatient Diabetes medications: None Current orders for Inpatient glycemic control: Metformin  XR 500 mg with dinner  Met with with patient at bedside with Raquel, Spanish interpreter.  Spoke with pt about DM diagnosis. Discussed basic pathophysiology of DM Type 2, basic home care, basic diabetes diet nutrition principles, importance of checking CBGs and maintaining good CBG control to prevent long-term and short-term complications. Reviewed signs and symptoms of hyperglycemia. Also reviewed blood sugar goals at home.  RNs to provide ongoing basic DM education at bedside with this patient.  Educated on The Plate Method, CHO's, portion control, avoiding caloric beverages, F/U with PCP every 3 months, bring meter to PCP office, long and short term complications of uncontrolled BG, and importance of exercise.  Discussed side effects of Metformin  and asked her to take with a meal.  Provided her with a ReliOn glucometer.  She can obtain more supplies for the glucometer at [facility] as needed.    Thank you, Wyvonna Pinal, MSN, CDCES Diabetes Coordinator Inpatient Diabetes Program [phone] (team pager from 8a-5p)

## 2023-10-25 NOTE — Patient Instructions (Signed)
 If interested in an outpatient lactation consult in office or virtually please reach out to us  at [facility] (First Floor) [address] [place] Please call [phone] and press 4 for lactation.    Lactation support groups:  [facility], Tuesdays 10:00 am -12:00 pm at [address] on the second floor in the conference room, lactating parents and lap babies welcome.  [email or web address]  [email or web address]  Si est interesado en una consulta ambulatoria sobre lactancia en el consultorio o virtualmente, comunquese con nosotros al [facility] Geophysicist/field seismologist (primer piso) [address] [place] [address] [place], [place] Por favor llame al [phone] y presione 4 para lactancia.  Grupos de apoyo para la lactancia:  [facility], martes de 10:00 a. m. a 12:00 p. m., en [address], segundo piso, sala de conferencias. Se admiten madres lactantes y bebs en regazo.  Vermell CINDERELLA Pelt, [employer] [facility] for [facility]

## 2023-10-26 NOTE — Lactation Note (Signed)
 This note was copied from a baby's chart.  NICU Lactation Consultation Note  Patient Name: Boy Ragina Fenter Unijb'd Date: 10/26/2023 Age:42 hours  Reason for consult: Initial assessment; NICU baby; 1st time breastfeeding; Early term 37-38.6wks; Other (Comment); Maternal endocrine disorder (No PNC as Mom didn't know she was pregnant) Type of Endocrine Disorder?: Diabetes (Type 2 Diabetes) Insulin  AMA SUBJECTIVE  LC in to visit with P2 Mom of ET infant [name] who delivered in [facility] and transferred to [facility].  Baby was noted to be hypoglycemic and transferred to NICU at about 4 hrs post delivery.  NICU RN asked LC if she was planning to visit with Mom.  This patient was completed by [facility] at 3 hrs old charting that Mom wanted to formula feed.  Chart says breastfeed.  Using the [employer], LC met with P2 Mom and talked about her goals on feeding baby and her history.  Mom has a 52 yr old son that she never produced any milk for.  Mom has always wanted to try again, but she didn't know she was having another baby until 2 days ago.    Baby is currently on continuous gastric feedings and tachypneic, so no PO feedings yet.  Mom desired to start pumping.  Mom reports that before she was discharged from [facility], she was set up with a pump and pumped once.  She brought her pump parts with her.  LC set up DEBP in room and provided Mom with a pumping band and size 18 mm flanges for a better fit.  Mom encouraged to do hands-on pumping to stimulate her milk supply.  Milk flowing and Mom was thrilled.    Plan recommended- 1- STS with baby when allowed 2- Massage breasts and hand express often 3- Pump both breasts on initiation setting until expressing >20 ml and then use maintain mode, every 2-3 hrs. 4- Utilize the handpump when home.  Encouraged rooming-in with baby to use the Medela Symphony.   OBJECTIVE Infant data: Mother's Current Feeding Choice: Breast Milk and  Formula  O2 Device: Room Air  Infant feeding assessment IDFTS - Readiness: 3   Maternal data: G2P1002 Vaginal, Spontaneous Has patient been taught Hand Expression?: Yes Hand Expression Comments: colostrum noted Significant Breast History:: Mom did not notice breast changes Current breast feeding challenges:: late onset of pumping Does the patient have breastfeeding experience prior to this delivery?: No Pumping frequency: initiated double pumping at 65 hrs post delivery. Pumped volume: 3 mL Flange Size: 18 Hands-free pumping top sizes: Large Alejos) Risk factor for low/delayed milk supply:: infant separation  WIC Program: No WIC Referral Sent?: Yes What county?: [place] Pump: Manual  ASSESSMENT Infant:  Feeding Status: Continuous gastric feedings Feeding method: Continuous Gastric  Maternal: Milk volume: Normal  INTERVENTIONS/PLAN Interventions: Interventions: Breast feeding basics reviewed; Skin to skin; Breast massage; Hand express; Coconut oil; DEBP; Hand pump; Education; CDC Guidelines for Breast Pump Cleaning Discharge Education: Engorgement and breast care Tools: Pump; Flanges; Hands-free pumping top Pump Education: Setup, frequency, and cleaning; Milk Storage  Plan: Consult Status: NICU follow-up NICU Follow-up type: Verify absence of engorgement; Verify onset of copious milk   Claudene Aleck BRAVO 10/26/2023, 3:59 PM

## 2023-10-26 NOTE — Lactation Note (Signed)
 This note was copied from a baby's chart. Lactation Consultation Note  Patient Name: Kristin Wall Unijb'd Date: 10/26/2023 Age:42 hours   NICU RN asked LC to see Mom.  Mom sleeping currently at baby's bedside.  LC will f/u later  Claudene Aleck BRAVO 10/26/2023, 1:37 PM

## 2023-10-30 NOTE — Lactation Note (Signed)
 This note was copied from a baby's chart.  NICU Lactation Consultation Note  Patient Name: Kristin Wall Unijb'd Date: 10/30/2023 Age:42 days  Reason for consult: Follow-up assessment; NICU baby; Exclusive pumping and bottle feeding; Maternal endocrine disorder Type of Endocrine Disorder?: Diabetes  SUBJECTIVE  LC in to visit with P2 Mom of baby Kristin Wall in the NICU.  Baby has been on continuous gastric feedings, but SLP states baby is going to start PO feedings.  LC assisted with pumping, provided another pumping band and larger bottles. Breasts full, but not engorged. Mom hasn't been pumping when home, just about 3 times when in baby's room.   Mom expressed 150 ml. Talked to Mom regarding baby breastfeeding and offered her assistance.  Mom said no to latching.  Mom states she will wait for baby to get better first.  Mom aware that if she changes her mind, to ask her nurse to call [facility] for assistance.  LC encouraged holding baby STS even if she doesn't plan to offer the breast.  LC noted that Mom has an appt with WIC in 8 days.  LC offered a [facility] loaner explaining how it would be a refundable deposit of $30 for 12 days.  Mom hasn't been pumping at home (had a hand pump only), but states she will pump if she had the Medela Symphony.    WIC loaner pump provided.  Mom very appreciative.    OBJECTIVE Infant data: No data recorded O2 Device: Room Air  Infant feeding assessment IDFTS - Readiness: 2   Maternal data: G2P1002 Vaginal, Spontaneous Pumping frequency: 3 times per 24 hrs, encouraged Mom to pump 6-8 times per 24 hrs Pumped volume: 150 mL Flange Size: 18 Hands-free pumping top sizes: Large Alejos)  WIC Program: Yes WIC Referral Sent?: Yes What county?: [place] Pump: [facility] Loaner (appt with WIC on 9/24)  ASSESSMENT Infant:  Feeding Status: Continuous gastric feedings Feeding method: Continuous Gastric  Maternal: Milk volume:  Normal  INTERVENTIONS/PLAN Interventions: Interventions: Breast feeding basics reviewed; Skin to skin; Breast massage; Hand express; DEBP; Education Discharge Education: Engorgement and breast care Tools: Pump; Flanges; Hands-free pumping top Pump Education: Setup, frequency, and cleaning; Milk Storage  Plan: Consult Status: NICU follow-up NICU Follow-up type: Verify absence of engorgement; Verify onset of copious milk; Verify DEBP issuance   Claudene Aleck BRAVO 10/30/2023, 12:09 PM

## 2023-11-02 ENCOUNTER — Ambulatory Visit (INDEPENDENT_AMBULATORY_CARE_PROVIDER_SITE_OTHER): Payer: Self-pay | Admitting: *Deleted

## 2023-11-02 ENCOUNTER — Other Ambulatory Visit: Payer: Self-pay

## 2023-11-02 VITALS — BP 139/84 | HR 70 | Wt 185.0 lb

## 2023-11-02 DIAGNOSIS — Z0130 Encounter for examination of blood pressure without abnormal findings: Secondary | ICD-10-CM

## 2023-11-02 MED ORDER — AMLODIPINE BESYLATE 10 MG PO TABS
10.0000 mg | ORAL_TABLET | Freq: Every day | ORAL | 0 refills | Status: AC
  Filled 2023-11-02: qty 90, 90d supply, fill #0

## 2023-11-02 NOTE — Progress Notes (Signed)
 Patient is here today for a blood pressure check following the birth of her child on 10/23/23.  Interpreter Therisa used for entirety of visit.  Patient's blood pressure today was 139/84 with a pulse of 70.   Reviewed blood pressure with Dr. Lola; he stated that she should continue taking Norvasc  and Metformin  and schedule her postpartum appointment at check out. Her discharge instructions had Synthroid  listed to take but the patient does not have an active prescription for it.  I verified with Dr. Lola that the patient does not need to take Synthroid  as her TSH level was normal when drawn while in the hospital.    Appointment is scheduled for 11/26/23.  Refill for Norvasc  sent to [facility].  Patient expresses understanding of plan with no further questions/concerns.    Rosina, RN

## 2023-11-07 NOTE — Lactation Note (Signed)
 This note was copied from a baby's chart.  NICU Lactation Consultation Note  Patient Name: Kristin Wall Unijb'd Date: 11/07/2023 Age:42 wk.o.  Reason for consult: Weekly NICU follow-up; NICU baby; Exclusive pumping and bottle feeding; Maternal endocrine disorder; Early term 29-38.6wks; Other (Comment) (AMA) Type of Endocrine Disorder?: Diabetes (T2DM)  SUBJECTIVE Visited with family of 24 9/62 weeks old NICU female Kristin Wall; Kristin Wall is a P2 and reported she's still pumping; she P/U her WIC pump yesterday and returned the loaner pump from the hospital, praised her for all her efforts. Noticed that pumping hasn't been consistent; her supply continues to dwindle. Provided extensive education about supply and demand and how frequent and continuous pumping will help increase her supply, she understanding that her body needs at least a week to see the results of any of these interventions. Reviewed strategies to increase supply such us  using a hospital grade pump,  STS with Kristin Wall and power pumping. Family is taking Kristin Wall home today. Reviewed discharge education and the importance of consistent pumping whenever he's having a bottle to protect/increase her supply as her plan is to continue exclusively pumping and bottle feedings. She politely declined a referral to [facility] OP, but she'll be following up with the local [facility] office for lactation care. MGM present. All questions and concerns answered, family to contact [facility] services PRN.  OBJECTIVE Infant data: Mother's Current Feeding Choice: Breast Milk and Formula  O2 Device: Room Air  Infant feeding assessment IDFTS - Readiness: 1 IDFTS - Quality: 2   Maternal data: G2P1002 Vaginal, Spontaneous Pumping frequency: 4 times/24 hours Pumped volume: 30 mL (30-35 ml)  WIC Program: Yes WIC Referral Sent?: Yes What county?: [place] Pump: WIC Pump  ASSESSMENT Infant: Feeding Status: Ad lib Feeding method: Bottle Nipple Type:  Dr. Jonna Fling Preemie  Maternal: Milk volume: Low  INTERVENTIONS/PLAN Interventions: Interventions: Breast feeding basics reviewed; Coconut oil; DEBP; Education Discharge Education: Outpatient recommendation  Plan: Consult Status: Complete   Zandra Lajeunesse S Jayona Mccaig 11/07/2023, 2:12 PM

## 2023-11-26 ENCOUNTER — Other Ambulatory Visit: Payer: Self-pay

## 2023-11-26 ENCOUNTER — Encounter: Payer: Self-pay | Admitting: Obstetrics and Gynecology

## 2023-11-26 ENCOUNTER — Other Ambulatory Visit (HOSPITAL_COMMUNITY): Payer: Self-pay

## 2023-11-26 ENCOUNTER — Ambulatory Visit (INDEPENDENT_AMBULATORY_CARE_PROVIDER_SITE_OTHER): Payer: Self-pay | Admitting: Obstetrics and Gynecology

## 2023-11-26 MED ORDER — LISINOPRIL 2.5 MG PO TABS: 2.5000 mg | ORAL_TABLET | Freq: Every day | ORAL | 0 refills | Status: DC

## 2023-11-26 MED ORDER — LISINOPRIL 2.5 MG PO TABS
2.5000 mg | ORAL_TABLET | Freq: Every day | ORAL | 0 refills | Status: AC
  Filled 2023-11-26: qty 60, 60d supply, fill #0

## 2023-11-26 MED ORDER — NORETHINDRONE 0.35 MG PO TABS
1.0000 | ORAL_TABLET | Freq: Every day | ORAL | 3 refills | Status: AC
  Filled 2023-11-26: qty 84, 84d supply, fill #0

## 2023-11-26 NOTE — Progress Notes (Signed)
 Post Partum Visit Note  Kristin Wall is a 43 y.o. G20P1002 female who presents for a postpartum visit. She is 4 weeks postpartum following a normal spontaneous vaginal delivery.  I have fully reviewed the prenatal and intrapartum course. The delivery was at unknown gestational weeks.  Anesthesia: none. Postpartum course has been complicated by blood pressure. Baby is doing well. Baby is feeding by bottle - Similac Advance. Bleeding no bleeding. Bowel function is normal. Bladder function is normal. Patient is not sexually active. Contraception method is OCP (estrogen/progesterone). Postpartum depression screening: negative.   Upstream - 11/26/23 0914       Pregnancy Intention Screening   Does the patient want to become pregnant in the next year? No    Does the patient's partner want to become pregnant in the next year? Yes    Would the patient like to discuss contraceptive options today? No      Contraception Wrap Up   Current Method Oral Contraceptive    End Method No Contraception Precautions    Contraception Counseling Provided No    How was the end contraceptive method provided? N/A         The pregnancy intention screening data noted above was reviewed. Potential methods of contraception were discussed. The patient elected to proceed with No Contraception Precautions.   Edinburgh Postnatal Depression Scale - 11/26/23 0913       Edinburgh Postnatal Depression Scale:  In the Past 7 Days   I have been able to laugh and see the funny side of things. 0    I have looked forward with enjoyment to things. 0    I have blamed myself unnecessarily when things went wrong. 0    I have been anxious or worried for no good reason. 0    I have felt scared or panicky for no good reason. 0    Things have been getting on top of me. 0    I have been so unhappy that I have had difficulty sleeping. 0    I have felt sad or miserable. 0    I have been so unhappy that I have been crying. 0     The thought of harming myself has occurred to me. 0    Edinburgh Postnatal Depression Scale Total 0          Health Maintenance Due  Topic Date Due   OPHTHALMOLOGY EXAM  Never done   Hepatitis C Screening  Never done   Hepatitis B Vaccines 19-59 Average Risk (1 of 3 - 19+ 3-dose series) Never done   HPV VACCINES (1 - 3-dose SCDM series) Never done   Pneumococcal Vaccine (2 of 2 - PCV) 10/12/2016   Diabetic kidney evaluation - Urine ACR  05/10/2018   HEMOGLOBIN A1C  06/13/2018   FOOT EXAM  08/11/2018   Cervical Cancer Screening (HPV/Pap Cotest)  06/06/2021   Mammogram  Never done   Influenza Vaccine  09/14/2023   COVID-19 Vaccine (1 - 2025-26 season) Never done    The following portions of the patient's history were reviewed and updated as appropriate: allergies, current medications, past family history, past medical history, past social history, past surgical history, and problem list.  Review of Systems Pertinent items are noted in HPI.  Objective:  BP (!) 156/85   Pulse 69   Wt 191 lb (86.6 kg)   Breastfeeding No   BMI 33.83 kg/m    General:  alert, cooperative, and no distress  GU exam:  not indicated       Assessment:   Tiane was seen today for postpartum care.  Diagnoses and all orders for this visit:  Postpartum care and examination -     Discontinue: lisinopril  (ZESTRIL ) 2.5 MG tablet; Take 1 tablet (2.5 mg total) by mouth daily. -     lisinopril  (ZESTRIL ) 2.5 MG tablet; Take 1 tablet (2.5 mg total) by mouth daily. -     norethindrone (MICRONOR) 0.35 MG tablet; Take 1 tablet (0.35 mg total) by mouth daily.     Plan:   Essential components of care per ACOG recommendations:  1.  Mood and well being: Patient with negative depression screening today. Reviewed local resources for support.  - Patient tobacco use? No.   - hx of drug use? No.    2. Infant care and feeding:  -Patient currently breastmilk feeding? No.  -Social determinants of health (SDOH)  reviewed in EPIC. No concerns  3. Sexuality, contraception and birth spacing - Patient does not want a pregnancy in the next year.  Desired family size is 2 children.  - Reviewed reproductive life planning. Reviewed contraceptive methods based on pt preferences and effectiveness.  Patient desired Oral Contraceptive today.   - Discussed birth spacing of 18 months  4. Sleep and fatigue -Encouraged family/partner/community support of 4 hrs of uninterrupted sleep to help with mood and fatigue  5. Physical Recovery  - Discussed patients delivery and complications. She describes her labor as good. - Patient had a Vaginal problems after delivery including shoulder dystocia. Patient had no laceration. Perineal healing reviewed. Patient expressed understanding - Patient has urinary incontinence? No. - Patient is safe to resume physical and sexual activity  6.  Health Maintenance - HM due items addressed Yes - Last pap smear  Diagnosis  Date Value Ref Range Status  06/06/2016   Final   NEGATIVE FOR INTRAEPITHELIAL LESIONS OR MALIGNANCY.   Pap smear not done at today's visit. She would like to have annual care at [facility].  -Breast Cancer screening indicated? Yes. Patient referred today for mammogram.  - Message sent to [facility]  7. Chronic Disease/Pregnancy Condition follow up: CHTN, DM2 - PCP follow up. Discussed CHTN likely. Given elevated BP added in Lisinopril  in addition to her Amlodipine . She would prefer to go to [facility]. If that is not possible, she is to contact us , and I will send a message to her prior PCP, Haze Servant. She does not need refills on any other prescriptions at this time.   Vina Solian, MD [facility], [facility]

## 2023-12-04 ENCOUNTER — Other Ambulatory Visit: Payer: Self-pay | Admitting: Obstetrics and Gynecology

## 2023-12-04 DIAGNOSIS — Z1231 Encounter for screening mammogram for malignant neoplasm of breast: Secondary | ICD-10-CM

## 2023-12-05 ENCOUNTER — Other Ambulatory Visit: Payer: Self-pay

## 2024-04-03 ENCOUNTER — Ambulatory Visit: Payer: Self-pay

## 2024-04-17 ENCOUNTER — Ambulatory Visit: Payer: Self-pay
